# Patient Record
Sex: Male | Born: 1980 | Race: White | Hispanic: No | Marital: Single | Smoking: Never smoker
Health system: Southern US, Community
[De-identification: ages and names within clinical notes are randomized; demographics above are authoritative.]

## PROBLEM LIST (undated history)

## (undated) HISTORY — PX: APPENDECTOMY: SHX54

---

## 2003-06-02 ENCOUNTER — Encounter: Payer: Self-pay | Admitting: Occupational Medicine

## 2003-06-02 ENCOUNTER — Encounter: Admission: RE | Admit: 2003-06-02 | Discharge: 2003-06-02 | Payer: Self-pay | Admitting: Occupational Medicine

## 2006-02-03 ENCOUNTER — Emergency Department (HOSPITAL_COMMUNITY): Admission: EM | Admit: 2006-02-03 | Discharge: 2006-02-03 | Payer: Self-pay | Admitting: Emergency Medicine

## 2014-01-22 ENCOUNTER — Encounter (HOSPITAL_COMMUNITY): Payer: Self-pay | Admitting: Emergency Medicine

## 2014-01-22 ENCOUNTER — Emergency Department (HOSPITAL_COMMUNITY)
Admission: EM | Admit: 2014-01-22 | Discharge: 2014-01-22 | Disposition: A | Discharge status: Home / Self Care | Payer: PRIVATE HEALTH INSURANCE | Attending: Emergency Medicine | Admitting: Emergency Medicine

## 2014-01-22 DIAGNOSIS — S61209A Unspecified open wound of unspecified finger without damage to nail, initial encounter: Secondary | ICD-10-CM | POA: Insufficient documentation

## 2014-01-22 DIAGNOSIS — Y929 Unspecified place or not applicable: Secondary | ICD-10-CM | POA: Insufficient documentation

## 2014-01-22 DIAGNOSIS — W268XXA Contact with other sharp object(s), not elsewhere classified, initial encounter: Secondary | ICD-10-CM | POA: Insufficient documentation

## 2014-01-22 DIAGNOSIS — Y9389 Activity, other specified: Secondary | ICD-10-CM | POA: Insufficient documentation

## 2014-01-22 DIAGNOSIS — S61412A Laceration without foreign body of left hand, initial encounter: Secondary | ICD-10-CM

## 2014-01-22 NOTE — Discharge Instructions (Signed)
Tissue Adhesive Wound Care °Some cuts, wounds, lacerations, and incisions can be repaired by using tissue adhesive. Tissue adhesive is like glue. It holds the skin together, allowing for faster healing. It forms a strong bond on the skin in about 1 minute and reaches its full strength in about 2 or 3 minutes. The adhesive disappears naturally while the wound is healing. It is important to take proper care of your wound at home while it heals.  °HOME CARE INSTRUCTIONS  °· Showers are allowed. Do not soak the area containing the tissue adhesive. Do not take baths, swim, or use hot tubs. Do not use any soaps or ointments on the wound. Certain ointments can weaken the glue. °· If a bandage (dressing) has been applied, follow your health care provider's instructions for how often to change the dressing.   °· Keep the dressing dry if one has been applied.   °· Do not scratch, pick, or rub the adhesive.   °· Do not place tape over the adhesive. The adhesive could come off when pulling the tape off.   °· Protect the wound from further injury until it is healed.   °· Protect the wound from sun and tanning bed exposure while it is healing and for several weeks after healing.   °· Only take over-the-counter or prescription medicines as directed by your health care provider.   °· Keep all follow-up appointments as directed by your health care provider. °SEEK IMMEDIATE MEDICAL CARE IF:  °· Your wound becomes red, swollen, hot, or tender.   °· You develop a rash after the glue is applied. °· You have increasing pain in the wound.   °· You have a red streak that goes away from the wound.   °· You have pus coming from the wound.   °· You have increased bleeding. °· You have a fever. °· You have shaking chills.   °· You notice a bad smell coming from the wound.   °· Your wound or adhesive breaks open.   °MAKE SURE YOU:  °· Understand these instructions. °· Will watch your condition. °· Will get help right away if you are not doing  well or get worse. °Document Released: 06/06/2001 Document Revised: 10/01/2013 Document Reviewed: 07/02/2013 °Exit[employer]® Patient Information ©2014 [employer]. ° °

## 2014-01-22 NOTE — ED Notes (Signed)
Finger was cleaned w/ shur clens & skin glue applied by PA.

## 2014-01-22 NOTE — ED Notes (Signed)
Pt alert & oriented x4, stable gait. Patient given discharge instructions, paperwork & prescription(s). Patient  instructed to stop at the registration desk to finish any additional paperwork. Patient verbalized understanding. Pt left department w/ no further questions. 

## 2014-01-22 NOTE — ED Provider Notes (Signed)
CSN: [id number]     Arrival date & time 01/22/14  1910 History   First MD Initiated Contact with Patient 01/22/14 1936     Chief Complaint  Patient presents with  . Extremity Laceration   (Consider location/radiation/quality/duration/timing/severity/associated sxs/prior Treatment) Patient is a 33 y.o. male presenting with skin laceration.  Laceration Location:  Hand Hand laceration location:  L hand Depth:  Cutaneous Quality: straight   Bleeding: controlled   Laceration mechanism:  Metal edge Foreign body present:  No foreign bodies Ineffective treatments:  None tried Tetanus status:  Up to date   History reviewed. No pertinent past medical history. Past Surgical History  Procedure Laterality Date  . Appendectomy     No family history on file. History  Substance Use Topics  . Smoking status: Never Smoker   . Smokeless tobacco: Not on file  . Alcohol Use: Yes     Comment: occ    Review of Systems  Skin: Positive for wound.  All other systems reviewed and are negative.    Allergies  Morphine and related  Home Medications  No current outpatient prescriptions on file. BP 124/78  Pulse 66  Temp(Src) 98.4 F (36.9 C) (Oral)  Resp 18  Ht 6' (1.829 m)  Wt 225 lb (102.059 kg)  BMI 30.51 kg/m2  SpO2 99% Physical Exam  Nursing note and vitals reviewed. Constitutional: He is oriented to person, place, and time. He appears well-developed and well-nourished.  HENT:  Head: Normocephalic and atraumatic.  Eyes: Pupils are equal, round, and reactive to light.  Neck: Normal range of motion.  Cardiovascular: Normal rate and regular rhythm.   Pulmonary/Chest: Effort normal and breath sounds normal.  Abdominal: Soft. Bowel sounds are normal.  Musculoskeletal: Normal range of motion. He exhibits no edema.  Lymphadenopathy:    He has no cervical adenopathy.  Neurological: He is alert and oriented to person, place, and time.  Skin: Skin is warm and dry.  Superficial  laceration to palmer surface of left middle finder from MIP to PIP, and at base of ring finger.  No foreign bodies or active bleeding.  Psychiatric: He has a normal mood and affect.    ED Course  Procedures (including critical care time) Labs Review Labs Reviewed - No data to display Imaging Review No results found.  EKG Interpretation   None      Superficial laceration to left middle and ring finger.  Wound cleansed, dermabond applied. MDM  Superficial laceration.  Tetanus within 5 years.    Norman Herrlich, NP 01/22/14 2132

## 2014-01-22 NOTE — ED Notes (Signed)
Pt reports getting cut to the left fourth finger this morning around 0900. Thinks last tetanus shot was in 2006.

## 2014-01-23 NOTE — ED Provider Notes (Signed)
Medical screening examination/treatment/procedure(s) were performed by non-physician practitioner and as supervising physician I was immediately available for consultation/collaboration.  Carmin Muskrat, MD 01/23/14 [phone]

## 2017-03-26 ENCOUNTER — Encounter (HOSPITAL_COMMUNITY): Payer: Self-pay

## 2017-03-26 ENCOUNTER — Emergency Department (HOSPITAL_COMMUNITY): Payer: Worker's Compensation

## 2017-03-26 ENCOUNTER — Emergency Department (HOSPITAL_COMMUNITY)
Admission: EM | Admit: 2017-03-26 | Discharge: 2017-03-26 | Disposition: A | Discharge status: Home / Self Care | Payer: Worker's Compensation | Attending: Emergency Medicine | Admitting: Emergency Medicine

## 2017-03-26 DIAGNOSIS — Y9389 Activity, other specified: Secondary | ICD-10-CM | POA: Diagnosis not present

## 2017-03-26 DIAGNOSIS — S01511A Laceration without foreign body of lip, initial encounter: Secondary | ICD-10-CM | POA: Diagnosis present

## 2017-03-26 DIAGNOSIS — Y990 Civilian activity done for income or pay: Secondary | ICD-10-CM | POA: Insufficient documentation

## 2017-03-26 DIAGNOSIS — W228XXA Striking against or struck by other objects, initial encounter: Secondary | ICD-10-CM | POA: Insufficient documentation

## 2017-03-26 DIAGNOSIS — R51 Headache: Secondary | ICD-10-CM | POA: Diagnosis not present

## 2017-03-26 DIAGNOSIS — S0181XA Laceration without foreign body of other part of head, initial encounter: Secondary | ICD-10-CM

## 2017-03-26 DIAGNOSIS — Y9289 Other specified places as the place of occurrence of the external cause: Secondary | ICD-10-CM | POA: Insufficient documentation

## 2017-03-26 DIAGNOSIS — T1490XA Injury, unspecified, initial encounter: Secondary | ICD-10-CM

## 2017-03-26 MED ORDER — IBUPROFEN 800 MG PO TABS: 800.0000 mg | ORAL_TABLET | Freq: Three times a day (TID) | ORAL | 0 refills | Status: DC

## 2017-03-26 MED ORDER — IBUPROFEN 800 MG PO TABS
800.0000 mg | ORAL_TABLET | Freq: Once | ORAL | Status: AC
  Administered 2017-03-26: 800 mg via ORAL
  Filled 2017-03-26: qty 1

## 2017-03-26 MED ORDER — ONDANSETRON 4 MG PO TBDP
4.0000 mg | ORAL_TABLET | Freq: Once | ORAL | Status: AC
  Administered 2017-03-26: 4 mg via ORAL
  Filled 2017-03-26: qty 1

## 2017-03-26 MED ORDER — HYDROCODONE-ACETAMINOPHEN 5-325 MG PO TABS: 1.0000 | ORAL_TABLET | Freq: Four times a day (QID) | ORAL | 0 refills | Status: DC | PRN

## 2017-03-26 MED ORDER — TETANUS-DIPHTH-ACELL PERTUSSIS 5-2.5-18.5 LF-MCG/0.5 IM SUSP
0.5000 mL | Freq: Once | INTRAMUSCULAR | Status: AC
  Administered 2017-03-26: 0.5 mL via INTRAMUSCULAR
  Filled 2017-03-26: qty 0.5

## 2017-03-26 MED ORDER — LIDOCAINE HCL (PF) 1 % IJ SOLN
5.0000 mL | Freq: Once | INTRAMUSCULAR | Status: AC
  Administered 2017-03-26: 5 mL
  Filled 2017-03-26: qty 5

## 2017-03-26 MED ORDER — MAGIC MOUTHWASH W/LIDOCAINE: 5.0000 mL | Freq: Three times a day (TID) | ORAL | 0 refills | Status: DC | PRN

## 2017-03-26 MED ORDER — HYDROCODONE-ACETAMINOPHEN 5-325 MG PO TABS
1.0000 | ORAL_TABLET | Freq: Once | ORAL | Status: AC
  Administered 2017-03-26: 1 via ORAL
  Filled 2017-03-26: qty 1

## 2017-03-26 NOTE — ED Triage Notes (Signed)
Pt was hit in the mouth with a wrench at work. He has an approximately 2 inch laceration to the left lip to the bottom of his nose. Pt denies LOC.

## 2017-03-26 NOTE — ED Provider Notes (Signed)
[facility] DEPT Provider Note   By signing my name below, I, [name], attest that this documentation has been prepared under the direction and in the presence of Avie Echevaria, PA-C. Electronically Signed: [name], ED Scribe. 03/26/2017. 5:03 PM.   History   Chief Complaint Chief Complaint  Patient presents with  . Laceration    The history is provided by the patient. No language interpreter was used.    HPI Comments:  Frederick Durham is a 36 y.o. male who presents to the Emergency Department complaining of a wound sustained to the upper lip that occurred PTA. Pt reports that he was operating machinery at work and a metal wrench attached to a rotor spun around and struck him in the lips and teeth. He fell off of the platform he was standing on from about 3 feet, and states that he is unsure if he lost consciousness. He felt shook by it, but was immediately aware of what had just happened and recalls the events. Pt complains of associated faintest headache and right knee pain at this time, which feels like it does after he runs. He notes that he chipped several of his front teeth, and he has had an abnormal bite since the incident because he feels that his teeth were shifted. He states that his mouth and teeth are not in significant pain. Bleeding is controlled with pressure dressing. Denies numbness, dizziness, nausea, vomiting.    History reviewed. No pertinent past medical history.  There are no active problems to display for this patient.   Past Surgical History:  Procedure Laterality Date  . APPENDECTOMY         Home Medications    Prior to Admission medications   Medication Sig Start Date End Date Taking? Authorizing Provider  HYDROcodone-acetaminophen (NORCO/VICODIN) 5-325 MG tablet Take 1 tablet by mouth every 6 (six) hours as needed for severe pain. 03/26/17   Emeline General, PA-C  ibuprofen (ADVIL,MOTRIN) 800 MG tablet Take 1 tablet (800 mg  total) by mouth 3 (three) times daily. 03/26/17   Emeline General, PA-C  magic mouthwash w/lidocaine SOLN Take 5 mLs by mouth 3 (three) times daily as needed for mouth pain. 03/26/17   Emeline General, PA-C    Family History History reviewed. No pertinent family history.  Social History Social History  Substance Use Topics  . Smoking status: Never Smoker  . Smokeless tobacco: Never Used  . Alcohol use Yes     Comment: occ     Allergies   Morphine and related   Review of Systems Review of Systems  HENT: Positive for dental problem.   Respiratory: Negative for wheezing and stridor.   Cardiovascular: Negative for chest pain, palpitations and leg swelling.  Gastrointestinal: Negative for abdominal distention, nausea and vomiting.  Musculoskeletal: Positive for arthralgias. Negative for gait problem, joint swelling, neck pain and neck stiffness.  Skin: Positive for wound. Negative for color change.  Neurological: Positive for syncope and headaches. Negative for dizziness, facial asymmetry, speech difficulty, weakness, light-headedness and numbness.  All other systems reviewed and are negative.     Physical Exam Updated Vital Signs BP (!) 126/91 (BP Location: Left Arm)   Pulse 78   Temp 99 F (37.2 C) (Oral)   Resp 19   SpO2 95%   Physical Exam  Constitutional: He is oriented to person, place, and time. He appears well-developed and well-nourished. No distress.  Patient is afebrile, non-toxic appearing, seating comfortably in chair  in no acute distress.   HENT:  Head: Normocephalic and atraumatic.  Right Ear: External ear normal.  Left Ear: External ear normal.  Mouth/Throat: Oropharynx is clear and moist. No oropharyngeal exudate.  Both lower incisors chipped. Front upper left incisor has a chip on the back side. 2cm laceration through and through not involving vermilion border. Right upper front tooth with small contusion on the gingiva above, and tooth appears to  be slightly posterior, but not moveable/loose.  Eyes: Conjunctivae and EOM are normal. Pupils are equal, round, and reactive to light. Right eye exhibits no discharge. Left eye exhibits no discharge.  Neck: Normal range of motion. Neck supple.  Cardiovascular: Normal rate, regular rhythm, normal heart sounds and intact distal pulses.   Pulmonary/Chest: Effort normal and breath sounds normal. No respiratory distress. He has no wheezes. He has no rales. He exhibits no tenderness.  Abdominal: He exhibits no distension.  Musculoskeletal: Normal range of motion. He exhibits no edema or deformity.  Tender to medial aspect of right knee. No swelling or bruising. Full ROM.  Neurological: He is alert and oriented to person, place, and time. No cranial nerve deficit or sensory deficit.  Neurologic Exam:   - Mental status: Patient is alert and cooperative. Fluent speech and words are clear. Coherent thought processes and insight is good. Patient is oriented x 4 to person, place, time and event.   - Cranial nerves:  CN III, IV, VI: pupils equally round, reactive to light both direct and conscensual and normal accommodation. Full extra-ocular movement. CN V: motor temporalis and masseter strength intact. CN VII : muscles of facial expression intact. CN X :  midline uvula. XI strength of sternocleidomastoid and trapezius muscles 5/5, XII: tongue is midline when protruded.  - Motor: No involuntary movements. Muscle tone and bulk normal throughout. Muscle strength is 5/5 in bilateral   grip, hip flexion, ankle dorsiflexion and plantar flexion.   - Sensory: Proprioception, light tough sensation intact in all extremities.   - Cerebellar:  and point to point movement intact in upper  extremities. Normal stance and gait  Skin: Skin is warm and dry. He is not diaphoretic. No erythema. No pallor.  Psychiatric: He has a normal mood and affect.  Nursing note and vitals reviewed.    ED Treatments / Results    DIAGNOSTIC STUDIES:  Oxygen Saturation is 95% on RA, adequate by my interpretation.    COORDINATION OF CARE:  4:57 PM Will repair laceration. Discussed treatment plan with pt at bedside and pt agreed to plan.   Labs (all labs ordered are listed, but only abnormal results are displayed) Labs Reviewed - No data to display  EKG  EKG Interpretation None       Radiology Ct Head Wo Contrast  Result Date: 03/26/2017 CLINICAL DATA:  Initial evaluation for acute trauma, hit in face with wrench. EXAM: CT HEAD WITHOUT CONTRAST CT MAXILLOFACIAL WITHOUT CONTRAST TECHNIQUE: Multidetector CT imaging of the head and maxillofacial structures were performed using the standard protocol without intravenous contrast. Multiplanar CT image reconstructions of the maxillofacial structures were also generated. COMPARISON:  None. FINDINGS: CT HEAD FINDINGS Brain: Cerebral volume normal. No acute intracranial hemorrhage. No evidence for acute large vessel territory infarct. No mass lesion, midline shift or mass effect. No hydrocephalus. No extra-axial fluid collection. Vascular: No asymmetric hyperdense vessel. Skull: Scalp soft tissues within normal limits.  Calvarium intact. Other: No mastoid effusion. CT MAXILLOFACIAL FINDINGS Osseous: Zygomatic arches intact no acute maxillary fracture. Pterygoid plates  intact. Nasal bones intact. Nasal septum mildly bowed to the left but intact. No acute mandibular fracture. Mandibular condyles normally situated. No acute abnormality about the dentition. Orbits: Globes intact. No retro-orbital hematoma or other pathology. Bony orbits intact without evidence for orbital floor fracture. Sinuses: Mild mucosal thickening within the left maxillary sinus. Paranasal sinuses are otherwise clear. Soft tissues: Soft tissue contusion and laceration present at the right upper lip just inferior to the nose. No radiopaque foreign body. Few scattered foci of soft tissue emphysema within this  region. No other acute soft tissue injury. IMPRESSION: CT HEAD: No acute intracranial process identified. CT MAXILLOFACIAL: 1. Soft tissue laceration/injury at the right upper lip/premaxillary region. No radiopaque foreign body. 2. No other acute maxillofacial injury identified.  No fracture. Electronically Signed   By: Jeannine Boga M.D.   On: 03/26/2017 18:06   Ct Maxillofacial Wo Contrast  Result Date: 03/26/2017 CLINICAL DATA:  Initial evaluation for acute trauma, hit in face with wrench. EXAM: CT HEAD WITHOUT CONTRAST CT MAXILLOFACIAL WITHOUT CONTRAST TECHNIQUE: Multidetector CT imaging of the head and maxillofacial structures were performed using the standard protocol without intravenous contrast. Multiplanar CT image reconstructions of the maxillofacial structures were also generated. COMPARISON:  None. FINDINGS: CT HEAD FINDINGS Brain: Cerebral volume normal. No acute intracranial hemorrhage. No evidence for acute large vessel territory infarct. No mass lesion, midline shift or mass effect. No hydrocephalus. No extra-axial fluid collection. Vascular: No asymmetric hyperdense vessel. Skull: Scalp soft tissues within normal limits.  Calvarium intact. Other: No mastoid effusion. CT MAXILLOFACIAL FINDINGS Osseous: Zygomatic arches intact no acute maxillary fracture. Pterygoid plates intact. Nasal bones intact. Nasal septum mildly bowed to the left but intact. No acute mandibular fracture. Mandibular condyles normally situated. No acute abnormality about the dentition. Orbits: Globes intact. No retro-orbital hematoma or other pathology. Bony orbits intact without evidence for orbital floor fracture. Sinuses: Mild mucosal thickening within the left maxillary sinus. Paranasal sinuses are otherwise clear. Soft tissues: Soft tissue contusion and laceration present at the right upper lip just inferior to the nose. No radiopaque foreign body. Few scattered foci of soft tissue emphysema within this region. No  other acute soft tissue injury. IMPRESSION: CT HEAD: No acute intracranial process identified. CT MAXILLOFACIAL: 1. Soft tissue laceration/injury at the right upper lip/premaxillary region. No radiopaque foreign body. 2. No other acute maxillofacial injury identified.  No fracture. Electronically Signed   By: Jeannine Boga M.D.   On: 03/26/2017 18:06    Procedures Procedures (including critical care time)  LACERATION REPAIR Performed by: Emeline General Authorized by: Emeline General Consent: Verbal consent obtained. Risks and benefits: risks, benefits and alternatives were discussed Consent given by: patient Patient identity confirmed: provided demographic data Prepped and Draped in normal sterile fashion Wound explored  Laceration Location: upper lip  Laceration Length: 2cm  No Foreign Bodies seen or palpated  Anesthesia: local infiltration  Local anesthetic: lidocaine 1% w/o epinephrine  Anesthetic total: 3 ml  Irrigation method: syringe Amount of cleaning: standard  Skin closure: 5.0 absorbable mucosa/ 5.0 prolene skin  Number of sutures: 6 absorbables/ 5 non-absorbable  Technique: simple interrupted  Patient tolerance: Patient tolerated the procedure well with no immediate complications.  Medications Ordered in ED Medications  Tdap (BOOSTRIX) injection 0.5 mL (0.5 mLs Intramuscular Given 03/26/17 1906)  lidocaine (PF) (XYLOCAINE) 1 % injection 5 mL (5 mLs Infiltration Given 03/26/17 1906)  ibuprofen (ADVIL,MOTRIN) tablet 800 mg (800 mg Oral Given 03/26/17 2102)  ondansetron (ZOFRAN-ODT) disintegrating tablet  4 mg (4 mg Oral Given 03/26/17 2113)  HYDROcodone-acetaminophen (NORCO/VICODIN) 5-325 MG per tablet 1 tablet (1 tablet Oral Given 03/26/17 2113)     Initial Impression / Assessment and Plan / ED Course  I have reviewed the triage vital signs and the nursing notes.  Pertinent labs & imaging results that were available during my care of the patient were  reviewed by me and considered in my medical decision making (see chart for details).    Patient presents with injury to the upper lip following industrial machinery incident. Patient has a normal neuro exam, CT maxillofacial negative for fractures and no acute pathology on head CT. He has sustained a through and through upper lip laceration measuring 2 cm.  Pressure irrigation performed. Wound explored and base of wound visualized in a bloodless field without evidence of foreign body.  Laceration occurred < 8 hours prior to repair which was well tolerated. Tdap updated.  Pt has  no comorbidities to effect normal wound healing. Pt discharged without antibiotics.  Discussed suture home care with patient and answered questions. Pt to follow-up for wound check and suture removal in 3-5 days; they are to return to the ED sooner for signs of infection. Pt is hemodynamically stable with no complaints prior to dc.   Patient has good follow-up with a dentist and he will be calling in the morning. Patient is well-appearing and stable, exam is otherwise unremarkable, Discharge with symptomatic relief, dentist and PCP follow-up.  Discussed strict return precautions and advised to return to the emergency department if experiencing any new or worsening symptoms. Instructions were understood and patient agreed with discharge plan.  Final Clinical Impressions(s) / ED Diagnoses   Final diagnoses:  Facial laceration, initial encounter    New Prescriptions Discharge Medication List as of 03/26/2017  9:06 PM    START taking these medications   Details  HYDROcodone-acetaminophen (NORCO/VICODIN) 5-325 MG tablet Take 1 tablet by mouth every 6 (six) hours as needed for severe pain., Starting Mon 03/26/2017, Print    ibuprofen (ADVIL,MOTRIN) 800 MG tablet Take 1 tablet (800 mg total) by mouth 3 (three) times daily., Starting Mon 03/26/2017, Print    magic mouthwash w/lidocaine SOLN Take 5 mLs by mouth 3 (three) times  daily as needed for mouth pain., Starting Mon 03/26/2017, Print      I personally performed the services described in this documentation, which was scribed in my presence. The recorded information has been reviewed and is accurate.     Emeline General, PA-C 03/27/17 Penndel Liu, MD 03/27/17 [phone]

## 2017-03-26 NOTE — ED Notes (Signed)
EDP at bedside suturing patient's laceration to face.

## 2017-03-26 NOTE — Discharge Instructions (Signed)
Keep the area clean and dry. Follow up with your dentist tomorrow. Return to PCP or Emergency department for suture removal in 3-5 days. Monitor for any signs of infection including increased pain, redness, warmth, swelling, fever and return to be seen if your experience any of these symptoms or any new concerning symptoms. Liquid diet for the next two days.

## 2017-04-13 DIAGNOSIS — Z0000 Encounter for general adult medical examination without abnormal findings: Secondary | ICD-10-CM | POA: Diagnosis not present

## 2017-04-26 ENCOUNTER — Other Ambulatory Visit (HOSPITAL_COMMUNITY): Payer: Self-pay | Admitting: Family Medicine

## 2017-04-26 ENCOUNTER — Ambulatory Visit (HOSPITAL_COMMUNITY)
Admission: RE | Admit: 2017-04-26 | Discharge: 2017-04-26 | Disposition: A | Discharge status: Home / Self Care | Payer: 59 | Source: Ambulatory Visit | Attending: Family Medicine | Admitting: Family Medicine

## 2017-04-26 DIAGNOSIS — M4316 Spondylolisthesis, lumbar region: Secondary | ICD-10-CM | POA: Diagnosis not present

## 2017-04-26 DIAGNOSIS — K769 Liver disease, unspecified: Secondary | ICD-10-CM | POA: Insufficient documentation

## 2017-04-26 DIAGNOSIS — M545 Low back pain: Secondary | ICD-10-CM | POA: Diagnosis not present

## 2017-04-26 DIAGNOSIS — R1031 Right lower quadrant pain: Secondary | ICD-10-CM | POA: Diagnosis not present

## 2017-04-26 DIAGNOSIS — N200 Calculus of kidney: Secondary | ICD-10-CM

## 2017-04-26 DIAGNOSIS — R109 Unspecified abdominal pain: Secondary | ICD-10-CM | POA: Diagnosis not present

## 2017-04-30 ENCOUNTER — Other Ambulatory Visit (HOSPITAL_COMMUNITY): Payer: Self-pay | Admitting: Family Medicine

## 2017-04-30 DIAGNOSIS — K7689 Other specified diseases of liver: Secondary | ICD-10-CM

## 2017-05-04 ENCOUNTER — Ambulatory Visit (HOSPITAL_COMMUNITY)
Admission: RE | Admit: 2017-05-04 | Discharge: 2017-05-04 | Disposition: A | Discharge status: Home / Self Care | Payer: 59 | Source: Ambulatory Visit | Attending: Family Medicine | Admitting: Family Medicine

## 2017-05-04 DIAGNOSIS — K7689 Other specified diseases of liver: Secondary | ICD-10-CM | POA: Insufficient documentation

## 2017-05-04 DIAGNOSIS — D1803 Hemangioma of intra-abdominal structures: Secondary | ICD-10-CM | POA: Insufficient documentation

## 2017-05-04 MED ORDER — GADOXETATE DISODIUM 0.25 MMOL/ML IV SOLN
10.0000 mL | Freq: Once | INTRAVENOUS | Status: AC | PRN
  Administered 2017-05-04: 10 mL via INTRAVENOUS

## 2017-06-22 DIAGNOSIS — D1803 Hemangioma of intra-abdominal structures: Secondary | ICD-10-CM | POA: Diagnosis not present

## 2017-06-22 DIAGNOSIS — R5383 Other fatigue: Secondary | ICD-10-CM | POA: Diagnosis not present

## 2017-09-25 ENCOUNTER — Ambulatory Visit (INDEPENDENT_AMBULATORY_CARE_PROVIDER_SITE_OTHER): Payer: 59 | Admitting: Urology

## 2017-09-25 DIAGNOSIS — E291 Testicular hypofunction: Secondary | ICD-10-CM | POA: Diagnosis not present

## 2017-09-28 DIAGNOSIS — E291 Testicular hypofunction: Secondary | ICD-10-CM | POA: Diagnosis not present

## 2017-10-04 DIAGNOSIS — J019 Acute sinusitis, unspecified: Secondary | ICD-10-CM | POA: Diagnosis not present

## 2017-10-26 ENCOUNTER — Encounter: Payer: Self-pay | Admitting: Internal Medicine

## 2017-12-31 ENCOUNTER — Telehealth: Payer: Self-pay | Admitting: Internal Medicine

## 2018-01-25 ENCOUNTER — Ambulatory Visit: Payer: 59 | Admitting: Internal Medicine

## 2018-01-25 ENCOUNTER — Encounter: Payer: Self-pay | Admitting: Internal Medicine

## 2018-01-25 VITALS — BP 116/78 | HR 64 | Ht 72.75 in | Wt 246.2 lb

## 2018-01-25 DIAGNOSIS — E221 Hyperprolactinemia: Secondary | ICD-10-CM | POA: Diagnosis not present

## 2018-01-25 DIAGNOSIS — D352 Benign neoplasm of pituitary gland: Secondary | ICD-10-CM | POA: Diagnosis not present

## 2018-01-25 DIAGNOSIS — E559 Vitamin D deficiency, unspecified: Secondary | ICD-10-CM

## 2018-01-25 DIAGNOSIS — E230 Hypopituitarism: Secondary | ICD-10-CM

## 2018-01-25 NOTE — Patient Instructions (Signed)
Please come back for labs at 8 am, fasting.  After we start Cabergoline, please come back for labs in 2 months.  Please come back for a follow-up appointment in 6 months.  Pituitary Tumors Pituitary tumors are abnormal growths found in the pituitary gland. The pituitary gland is a small organ-about the size of a dime-located in the center of the brain. It makes hormones that affect growth and the functions of other glands in the body. Most pituitary tumors are benign. This means they are noncancerous. They grow slowly and do not spread to other parts of the body. A pituitary tumor may make the pituitary gland produce too many hormones. Tumors that make hormones are called functioning tumors (those that do not make hormones are called nonfunctioning tumors). Problems that can be caused by pituitary tumors include:  Cushing disease. This disease causes fat to build up in the face, back, and chest while the arms and legs become thin.  Acromegaly. This is a condition in which the hands, feet, and face are larger than normal.  Breast milk production even though there is no pregnancy.  What are the causes? The cause of most pituitary tumors is not known. In some cases, these kinds of tumors run in a family. What increases the risk? Some cases of pituitary tumors are due to genetic factors that a person inherits that increase the likelihood of developing certain tumors, including pituitary tumors. What are the signs or symptoms?  Headaches.  Vision problems.  Weakness or low energy.  Clear fluid draining from the nose.  Changes in the sense of smell.  Feeling sick to your stomach (nauseous) and vomiting.  Problems caused by the production of too many hormones, such as: ? Infertility. ? Loss of menstrual periods in women. ? Abnormal growth. ? High blood pressure (hypertension). ? Heat or cold intolerance. ? Other skin and body changes. ? Nipple discharge. ? Decreased sexual  function. How is this diagnosed? If you develop symptoms, you will be sent for a CT scan or MRI to look for pituitary tumors. If you know that these kinds of tumors run in your family, you may need to have your blood tested regularly to monitor pituitary hormone levels. How is this treated? These tumors are best treated when they are found and diagnosed early. Treatments include:  Surgical removal of the tumor. This is the most common treatment.  Radiation therapy. During this treatment, high doses of X-rays are used to kill tumor cells.  Drug therapy. This involves using certain medicines to block the pituitary gland from producing too many hormones.  Follow these instructions at home:  Drink plenty of fluids.  Measure your urine output if directed to do so by your health care provider.  Do not pick your nose or remove any crusting.  Do not do any activities that require straining.  Take all medicines as directed by your health care provider.  Keep follow-up appointments as directed by your health care provider. Contact a health care provider if:  You have sudden, unusual thirst.  You are urinating frequently.  You have a headache that will not go away.  You have new vision changes.  You notice clear fluid leaking from your nose or ears, a sensation of fluid trickling down the back of your throat, or a salty taste in your mouth.  You are having trouble concentrating. Get help right away if:  Your symptoms suddenly become severe.  You have a nosebleed that does not stop  after a few minutes.  You have a fever over 101F (38.3C).  You have a severe headache or a stiff neck.  You are confused or not as alert as usual.  You have chest pain or shortness of breath. This information is not intended to replace advice given to you by your health care provider. Make sure you discuss any questions you have with your health care provider. Document Released: 12/01/2002  Document Revised: 05/18/2016 Document Reviewed: 06/13/2013 Elsevier Interactive Patient Education  [employer].

## 2018-01-25 NOTE — Progress Notes (Signed)
Patient ID: Frederick Durham, male   DOB: [birth year], 37 y.o.   MRN: [id number]    HPI  Frederick Durham is a 37 y.o.-year-old male, referred by his PCP, Dr. Gar Ponto, for evaluation for a subcentimeter pituitary mass associated with high prolactin level and hypogonadotropic hypogonadism.  He is here with his wife who offers part of the history, especially related to the timeline and nature of his symptoms.  Patient describes that approximately 9-10 months ago he started to have increased fatigue, slight anxiety/depression, irritability.  Since then, he also started to gain weight (he believes he gained approximately 15 pounds over the last 6 months), also increased sweating.  He has slight decrease of libido but no problems with erections.  He saw his PCP and complained of the above symptoms.  The testosterone level was checked and it was low.  He was referred to urology.  Reviewed note and lab results from Dr. Diona Fanti.  An 8 am testosterone level was confirmed low (see below), and this was associated with inappropriately low LH and FSH (hypogonadotropic hypogonadism), but also a very high prolactin level, at 150.8.  A pituitary MRI was checked and this showed a microadenoma. At that point,patient was referred to endocrinology.  Pertinent labs reviewed: 09/28/2017: PRL 150.8, FSH 1.1, LH 2.3, 8 am testosterone: 119 [phone] Free T: 25.1 (46-224) No results found for: PROLACTIN   Pituitary MRI (10/26/2017): 7.9 x 7.3 x 4.2 mm nonenhancing pituitary nodule.  The actual images are not available for review. Will request the CD with the images.  Pt. also has a history of vitamin D deficiency >> prev. On 2000 IU daily >> now off.  ROS: Constitutional: + see HPI Eyes: no blurry vision, no xerophthalmia ENT: no sore throat, no nodules felt in neck no dysphagia/odynophagia, no hoarseness Cardiovascular: no CP/palpitations/leg swelling Respiratory: no cough/+ SOB Gastrointestinal: no  N/V/D/C Musculoskeletal: + muscle/no joint aches Skin: no rashes Neurological: no tremors/numbness/tingling/dizziness Psychiatric: + both: depression/anxiety  Past medical history: - per HPI  Past Surgical History:  Procedure Laterality Date  . APPENDECTOMY     Social History   Socioeconomic History  . Marital status: married    Spouse name: Not on file  . Number of children: 21 - som (75 years old in 12/2017)  Social Needs  Occupational History  . machinist  Tobacco Use  . Smoking status: Never Smoker  . Smokeless tobacco: Never Used  Substance and Sexual Activity  . Alcohol use: Yes    Comment: Beer, once a month, 3-6 drinks  . Drug use: No   Not on any Rx med.  Allergies  Allergen Reactions  . Morphine And Related Hives   FH: - M with valvular insufficiency  PE: BP 116/78   Pulse 64   Ht 6' 0.75" (1.848 m)   Wt 246 lb 3.2 oz (111.7 kg)   SpO2 95%   BMI 32.71 kg/m  Wt Readings from Last 3 Encounters:  01/25/18 246 lb 3.2 oz (111.7 kg)  01/22/14 225 lb (102.1 kg)   Constitutional: overweight, in NAD Eyes: PERRLA, EOMI, no exophthalmos ENT: moist mucous membranes, no thyromegaly, no cervical lymphadenopathy Cardiovascular: RRR, No MRG Respiratory: CTA B Gastrointestinal: abdomen soft, NT, ND, BS+ Musculoskeletal: no deformities, strength intact in all 4 Skin: moist, warm, no rashes Neurological: no tremor with outstretched hands, DTR normal in all 4   ASSESSMENT: 1.  Elevated prolactin  2.  Pituitary microadenoma   3.  Hypogonadotropic hypogonadism  4.  Vitamin D deficiency  PLAN:  1. And 2. Patient with a newly diagnosed pituitary microadenoma, in the setting of hypogonadotropic hypogonadism and hyperprolactinemia. - I reviewed the pituitary MRI report along with the patient. Snce the tumor is under 1 cm, this qualifies as a micro-, rather than a macro-adenoma.  - We discussed that his pituitary mass is not consider brain cancer and the vast  majority of these tumors are benign. - From the MRI report, it does not appear that the tumor has extended up towards the optic chiasm, and indeed, he does not describe any visual field cuts, or diplopia. - We discussed treatment of prolactinomas, with either cabergoline or bromocriptine.  I explained the benefits of cabergoline over bromocriptine.  We will be able to use  cabergoline less frequently than bromocriptine, it has less side effects, and it has the potential to shrink his tumor.  We did discuss about possible side effects to include: Headaches, congestion, dizziness.  I advised him to take it at bedtime. - We discussed about timeline for checking his prolactin again after we started cabergoline: First check will be in 2 months  - For now, we need to check his pituitary function, along with a repeat of his prolactin and testosterone levels, since last levels available up from 4 months ago. - We also discussed about the possibility of giving him a drug holiday in 2-5 years, depending on his prolactin levels on cabergoline  3. Patient with low total and free testosterone 2/2 hypogonadotropic hypogonadism 2/2 hyperprolactinemia - he was able to father a son 2 years ago  -no problems with fertility at that time  - No complaints of erectile dysfunction, but libido slightly low - We discussed that his testosterone level most likely start to improve as the prolactin starts to decrease - We will check another testosterone level now, and then after normalization of prolactin.  4.  Vitamin D deficiency - I explained that persistently high prolactin/low testosterone can lead to bone fragility.  A concomitant vitamin D deficiency can also contribute. - As he has a history of vitamin D deficiency and he is off supplements, we will check another level now  Orders Placed This Encounter  Procedures  . T3, free  . T4, free  . Insulin-like growth factor  . Cortisol  . Prolactin  . TSH  . ACTH  .  Testosterone Free with SHBG  . Vitamin D, 25-hydroxy   Component     Latest Ref Rng & Units 02/01/2018  Sex Hormone Binding Globulin     nmol/L 21.9  IGF-I, LC/MS     53 - 331 ng/mL 120  Z-Score (Male)     -2.0 - 2 SD -0.4  VITD     30.00 - 100.00 ng/mL 22.46 (L)  C206 ACTH     6 - 50 pg/mL 26  TSH     0.35 - 4.50 uIU/mL 1.72  Prolactin     2.0 - 18.0 ng/mL 133.0 (H)  Cortisol, Plasma     ug/dL 11.4  T4,Free(Direct)     0.60 - 1.60 ng/dL 0.97  Triiodothyronine,Free,Serum     2.3 - 4.2 pg/mL 3.2   Vitamin D is low, I would suggest to start 5000 units vitamin D daily. Prolactin is high, will start 0.25 mg cabergoline twice a week.  I will have him come back in 2 months for a prolactin and a vitamin D check.  Component     Latest Ref Rng & Units 02/01/2018  Testosterone, Serum (Total)     ng/dL 132 (L)  % Free Testosterone     % 2.5  Free Testosterone, S     pg/mL 33 (L)  Sex Hormone Binding Globulin     nmol/L 21.9  Low Testosterone >> we will continue to monitor, since this should improve with improvement in the prolactin level.  Philemon Kingdom, MD PhD [facility] Endocrinology

## 2018-02-01 ENCOUNTER — Other Ambulatory Visit (INDEPENDENT_AMBULATORY_CARE_PROVIDER_SITE_OTHER): Payer: 59

## 2018-02-01 DIAGNOSIS — E559 Vitamin D deficiency, unspecified: Secondary | ICD-10-CM

## 2018-02-01 DIAGNOSIS — D352 Benign neoplasm of pituitary gland: Secondary | ICD-10-CM

## 2018-02-01 LAB — T4, FREE: FREE T4: 0.97 ng/dL (ref 0.60–1.60)

## 2018-02-01 LAB — TSH: TSH: 1.72 u[IU]/mL (ref 0.35–4.50)

## 2018-02-01 LAB — T3, FREE: T3, Free: 3.2 pg/mL (ref 2.3–4.2)

## 2018-02-01 LAB — CORTISOL: CORTISOL PLASMA: 11.4 ug/dL

## 2018-02-01 LAB — VITAMIN D 25 HYDROXY (VIT D DEFICIENCY, FRACTURES): VITD: 22.46 ng/mL — AB (ref 30.00–100.00)

## 2018-02-05 ENCOUNTER — Other Ambulatory Visit: Payer: Self-pay | Admitting: Internal Medicine

## 2018-02-05 LAB — PROLACTIN: Prolactin: 133 ng/mL — ABNORMAL HIGH (ref 2.0–18.0)

## 2018-02-05 LAB — ACTH: C206 ACTH: 26 pg/mL (ref 6–50)

## 2018-02-05 LAB — INSULIN-LIKE GROWTH FACTOR
IGF-I, LC/MS: 120 ng/mL (ref 53–331)
Z-Score (Male): -0.4 SD (ref ?–2.0)

## 2018-02-05 MED ORDER — CABERGOLINE 0.5 MG PO TABS: 0.2500 mg | ORAL_TABLET | ORAL | 6 refills | Status: DC

## 2018-02-06 LAB — TESTOSTERONE, FREE AND TOTAL (INCLUDES SHBG)-(MALES)
% FREE TESTOSTERONE: 2.5 %
FREE TESTOSTERONE, S: 33 pg/mL — AB
SEX HORMONE BINDING GLOBULIN: 21.9 nmol/L
TESTOSTERONE, SERUM (TOTAL): 132 ng/dL — AB

## 2018-03-22 ENCOUNTER — Telehealth: Payer: Self-pay | Admitting: Internal Medicine

## 2018-03-22 NOTE — Telephone Encounter (Signed)
Patient wife is calling to let you know [facility] is sending over patient MRI results. Just a fyi  Let her know when they come

## 2018-03-25 ENCOUNTER — Encounter: Payer: Self-pay | Admitting: Internal Medicine

## 2018-03-25 NOTE — Telephone Encounter (Signed)
Noted  
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
## 2018-04-19 ENCOUNTER — Other Ambulatory Visit (INDEPENDENT_AMBULATORY_CARE_PROVIDER_SITE_OTHER): Payer: 59

## 2018-04-19 DIAGNOSIS — E559 Vitamin D deficiency, unspecified: Secondary | ICD-10-CM

## 2018-04-19 DIAGNOSIS — D352 Benign neoplasm of pituitary gland: Secondary | ICD-10-CM | POA: Diagnosis not present

## 2018-04-19 LAB — VITAMIN D 25 HYDROXY (VIT D DEFICIENCY, FRACTURES): VITD: 41.55 ng/mL (ref 30.00–100.00)

## 2018-04-20 LAB — PROLACTIN: PROLACTIN: 18.8 ng/mL — AB (ref 2.0–18.0)

## 2018-05-13 DIAGNOSIS — J069 Acute upper respiratory infection, unspecified: Secondary | ICD-10-CM | POA: Diagnosis not present

## 2018-05-13 DIAGNOSIS — J029 Acute pharyngitis, unspecified: Secondary | ICD-10-CM | POA: Diagnosis not present

## 2018-08-13 ENCOUNTER — Ambulatory Visit: Payer: Self-pay | Admitting: Internal Medicine

## 2018-08-16 ENCOUNTER — Encounter: Payer: Self-pay | Admitting: Internal Medicine

## 2018-08-16 ENCOUNTER — Ambulatory Visit: Payer: 59 | Admitting: Internal Medicine

## 2018-08-16 VITALS — BP 122/80 | HR 72 | Ht 72.75 in | Wt 236.6 lb

## 2018-08-16 DIAGNOSIS — E230 Hypopituitarism: Secondary | ICD-10-CM

## 2018-08-16 DIAGNOSIS — E559 Vitamin D deficiency, unspecified: Secondary | ICD-10-CM | POA: Diagnosis not present

## 2018-08-16 DIAGNOSIS — E221 Hyperprolactinemia: Secondary | ICD-10-CM | POA: Diagnosis not present

## 2018-08-16 DIAGNOSIS — D352 Benign neoplasm of pituitary gland: Secondary | ICD-10-CM | POA: Diagnosis not present

## 2018-08-16 NOTE — Patient Instructions (Signed)
Please continue Cabergoline 0.25 mg 2x a week.  Continue vitamin D 5000 units daily.  Please come back for labs fasting, 8-9 am.  Please come back for a follow-up appointment in 1 year.

## 2018-08-16 NOTE — Progress Notes (Addendum)
Patient ID: Frederick Durham, male   DOB: 01-11-1981, 37 y.o.   MRN: [id number]    HPI  Frederick Durham is a 37 y.o.-year-old male, initially referred by his PCP, Dr. Gar Ponto, returning for follow-up for subcentimeter pituitary mass associated with high prolactin level and hypogonadotropic hypogonadism.  Last visit 6 months ago.  Reviewed history: Patient describes that in 2018 he started to have increased fatigue, slight anxiety/depression, irritability.  Since then, he also started to gain weight (he believes he gained approximately 15 pounds over the last 6 months), also increased sweating.  He has slight decrease of libido but no problems with erections.  He saw his PCP and complained of the above symptoms.  The testosterone level was checked and it was low.  He was referred to urology.  Reviewed note and lab results from Dr. Diona Fanti.  An 8 am testosterone level was confirmed low (see below), and this was associated with inappropriately low LH and FSH (hypogonadotropic hypogonadism), but also a very high prolactin level, at 150.8.  A pituitary MRI was checked and this showed a microadenoma. At that point,patient was referred to endocrinology.  At last visit, we reviewed together his prolactin levels and prolactin was still elevated, at 133.  We started cabergoline 0.25 mg twice a week and prolactin decreased nicely to 18.8.  We continued the same dose pending the results today.  He does not report side effects to cabergoline, other than a slight headache but not with every dose.  No nausea, dizziness, congestion.  Pertinent labs were reviewed: Lab Results  Component Value Date   PROLACTIN 18.8 (H) 04/19/2018   PROLACTIN 133.0 (H) 02/01/2018  09/28/2017: PRL 150.8, FSH 1.1, LH 2.3, 8 am testosterone: 119 [phone]), Free T: 25.1 (46-224)  Pituitary MRI (10/26/2017): 7.9 x 7.3 x 4.2 mm nonenhancing pituitary nodule.  The actual images are not available for review.  We will need to request the  CD with the images.  His testosterone level was low at last check, but the rest of the pituitary labs were normal: Component     Latest Ref Rng & Units 02/01/2018  Testosterone, Serum (Total)     ng/dL 132 (L)  % Free Testosterone     % 2.5  Free Testosterone, S     pg/mL 33 (L)  Sex Hormone Binding Globulin     nmol/L 21.9  IGF-I, LC/MS     53 - 331 ng/mL 120  Z-Score (Male)     -2.0 - 2 SD -0.4  C206 ACTH     6 - 50 pg/mL 26  TSH     0.35 - 4.50 uIU/mL 1.72  Cortisol, Plasma     ug/dL 11.4  T4,Free(Direct)     0.60 - 1.60 ng/dL 0.97  Triiodothyronine,Free,Serum     2.3 - 4.2 pg/mL 3.2   No decrease in libido or problems with erections.  Pt. also has a history of vitamin D deficiency >> prev. On 2000 IU daily >> then off, however, we restarted vitamin D supplements at 5000 units daily at last visit after vitamin D level returned low. The level normalized. Lab Results  Component Value Date   VD25OH 41.55 04/19/2018   VD25OH 22.46 (L) 02/01/2018   ROS: Constitutional: no weight gain/no weight loss, + occasional fatigue, no subjective hyperthermia, no subjective hypothermia Eyes: no blurry vision, no xerophthalmia ENT: no sore throat, no nodules palpated in throat, no dysphagia, no odynophagia, no hoarseness Cardiovascular: no CP/no SOB/no palpitations/no leg  swelling Respiratory: no cough/no SOB/no wheezing Gastrointestinal: no N/no V/no D/no C/no acid reflux Musculoskeletal: no muscle aches/no joint aches Skin: no rashes, no hair loss Neurological: no tremors/no numbness/no tingling/no dizziness, + occasional headache  I reviewed pt's medications, allergies, PMH, social hx, family hx, and changes were documented in the history of present illness. Otherwise, unchanged from my initial visit note.  Past medical history: - per HPI  Past Surgical History:  Procedure Laterality Date  . APPENDECTOMY     Social History   Socioeconomic History  . Marital status:  married    Spouse name: Not on file  . Number of children: 55 - som (3 years old in 12/2017)  Social Needs  Occupational History  . machinist  Tobacco Use  . Smoking status: Never Smoker  . Smokeless tobacco: Never Used  Substance and Sexual Activity  . Alcohol use: Yes    Comment: Beer, once a month, 3-6 drinks  . Drug use: No   Not on any Rx med.  Allergies  Allergen Reactions  . Morphine And Related Hives   FH: - M with valvular insufficiency  PE: BP 122/80   Pulse 72   Ht 6' 0.75" (1.848 m)   Wt 236 lb 9.6 oz (107.3 kg)   SpO2 96%   BMI 31.43 kg/m  Wt Readings from Last 3 Encounters:  08/16/18 236 lb 9.6 oz (107.3 kg)  01/25/18 246 lb 3.2 oz (111.7 kg)  01/22/14 225 lb (102.1 kg)   Constitutional: overweight, in NAD Eyes: PERRLA, EOMI, no exophthalmos ENT: moist mucous membranes, no thyromegaly, no cervical lymphadenopathy Cardiovascular: RRR, No MRG Respiratory: CTA B Gastrointestinal: abdomen soft, NT, ND, BS+ Musculoskeletal: no deformities, strength intact in all 4 Skin: moist, warm, no rashes Neurological: no tremor with outstretched hands, DTR normal in all 4   ASSESSMENT: 1.  Elevated prolactin  2.  Pituitary microadenoma   3.  Hypogonadotropic hypogonadism  4.  Vitamin D deficiency  PLAN:  1. And 2.  Patient with relatively recent diagnosis of pituitary microadenoma discovered after diagnosis of hypogonadotropic hypogonadism and hyperprolactinemia -Reviewing the pituitary MRI report his tumor is under 1 cm, therefore, a microadenoma -We discussed that the tumor did not extend towards the optic chiasm and he does not have any complaints of visual field cuts -At last visit we started cabergoline 0.25 mg twice a week and we checked his prolactin level 2 months after he started therapy.  His prolactin level was significantly decreased, to 18.8.  At that time, we continued the same dose of cabergoline, however, we will check another level today and I  believe that we can safely decrease the dose of cabergoline to 0.25 mg once a week after this visit -In 2 to 5 years we may be able to give him a drug holiday from cabergoline depending on his prolactin levels  3. Patient with low total and free testosterone due to hypogonadotropic hypogonadism in the setting of hyperprolactinemia -He was able to father a child 2.5 years ago.  No fertility problems at that time -She had slightly low libido but no problems with erectile dysfunction at last visit.  At that time, we checked a testosterone level and his free testosterone was low, however, since we were treating his hyperprolactinemia, we decided to wait until this visit to recheck it to see if improvement in his prolactin level will be reflecting an improvement in his testosterone level. -We will check his testosterone at next lab draw, fasting, between 8  and 9 in the morning  4.  Vitamin D deficiency -His vitamin D was low at last visit, a 22.46.  I advised him to start 5000 units vitamin D daily. -His vitamin D level normalized on the supplement.  To make sure he is not taking too much, we will recheck his vitamin D level today.  Orders Placed This Encounter  Procedures  . Testosterone Free with SHBG  . Prolactin  . VITAMIN D 25 Hydroxy (Vit-D Deficiency, Fractures)   Component     Latest Ref Rng & Units 08/23/2018  Testosterone, Serum (Total)     ng/dL 355  % Free Testosterone     % 2.9  Free Testosterone, S     pg/mL 103  Sex Hormone Binding Globulin     nmol/L 19.2  VITD     30.00 - 100.00 ng/mL 56.29  Prolactin     2.0 - 18.0 ng/mL 18.2 (H)   Testosterone level has normalized. PRL stable. Normal vit D. Continue current Cabergoline and vit D dose.  Philemon Kingdom, MD PhD [facility] Endocrinology

## 2018-08-23 ENCOUNTER — Other Ambulatory Visit (INDEPENDENT_AMBULATORY_CARE_PROVIDER_SITE_OTHER): Payer: 59

## 2018-08-23 DIAGNOSIS — E559 Vitamin D deficiency, unspecified: Secondary | ICD-10-CM

## 2018-08-23 DIAGNOSIS — D352 Benign neoplasm of pituitary gland: Secondary | ICD-10-CM

## 2018-08-23 DIAGNOSIS — E230 Hypopituitarism: Secondary | ICD-10-CM

## 2018-08-23 LAB — VITAMIN D 25 HYDROXY (VIT D DEFICIENCY, FRACTURES): VITD: 56.29 ng/mL (ref 30.00–100.00)

## 2018-08-24 LAB — PROLACTIN: PROLACTIN: 18.2 ng/mL — AB (ref 2.0–18.0)

## 2018-08-31 LAB — TESTOSTERONE, FREE AND TOTAL (INCLUDES SHBG)-(MALES)
% FREE TESTOSTERONE: 2.9 %
Free Testosterone, S: 103 pg/mL
SEX HORMONE BINDING GLOBULIN: 19.2 nmol/L
TESTOSTERONE, SERUM (TOTAL): 355 ng/dL

## 2019-03-04 ENCOUNTER — Other Ambulatory Visit: Payer: Self-pay | Admitting: Internal Medicine

## 2019-05-20 ENCOUNTER — Other Ambulatory Visit: Payer: Self-pay | Admitting: Internal Medicine

## 2019-08-10 ENCOUNTER — Other Ambulatory Visit: Payer: Self-pay | Admitting: Internal Medicine

## 2019-08-19 ENCOUNTER — Other Ambulatory Visit: Payer: Self-pay

## 2019-08-22 ENCOUNTER — Ambulatory Visit: Payer: 59 | Admitting: Internal Medicine

## 2019-08-22 ENCOUNTER — Other Ambulatory Visit: Payer: Self-pay

## 2019-08-22 ENCOUNTER — Encounter: Payer: Self-pay | Admitting: Internal Medicine

## 2019-08-22 VITALS — BP 110/70 | HR 60 | Ht 72.75 in | Wt 235.0 lb

## 2019-08-22 DIAGNOSIS — E230 Hypopituitarism: Secondary | ICD-10-CM | POA: Diagnosis not present

## 2019-08-22 DIAGNOSIS — D352 Benign neoplasm of pituitary gland: Secondary | ICD-10-CM

## 2019-08-22 DIAGNOSIS — E559 Vitamin D deficiency, unspecified: Secondary | ICD-10-CM | POA: Diagnosis not present

## 2019-08-22 DIAGNOSIS — E221 Hyperprolactinemia: Secondary | ICD-10-CM

## 2019-08-22 LAB — VITAMIN D 25 HYDROXY (VIT D DEFICIENCY, FRACTURES): VITD: 52.63 ng/mL (ref 30.00–100.00)

## 2019-08-22 MED ORDER — CABERGOLINE 0.5 MG PO TABS: ORAL_TABLET | ORAL | 3 refills | Status: DC

## 2019-08-22 NOTE — Progress Notes (Signed)
Patient ID: Frederick Durham, male   DOB: 05-13-1981, 38 y.o.   MRN: [id number]    HPI  Frederick Durham is a 38 y.o.-year-old male, initially referred by his PCP, Dr. Gar Durham, returning for follow-up for subcentimeter pituitary mass associated with high prolactin level, hypogonadotropic hypogonadism, vitamin D deficiency.  Last visit 1 year ago.  He had a sinus infection 6 weeks ago.   Reviewed history: Patient describes that in 2018 he started to have increased fatigue, slight anxiety/depression, irritability.  Since then, he also started to gain weight (he believes he gained approximately 15 pounds over the last 6 months), also increased sweating.  He has slight decrease of libido but no problems with erections.  He saw his PCP and complained of the above symptoms.  The testosterone level was checked and it was low.  He was referred to urology.  Reviewed note and lab results from Dr. Diona Durham.  An 8 am testosterone level was confirmed low (see below), and this was associated with inappropriately low LH and FSH (hypogonadotropic hypogonadism), but also a very high prolactin level, at 150.8.  A pituitary MRI was checked and this showed a microadenoma. At that point,patient was referred to endocrinology.  Patient with history of elevated prolactin, at 150, which improved significantly after addition of cabergoline 0.25 mg twice a week.  Latest prolactin level was reviewed along with the patient: This was only slightly above the upper limit of normal (normal interval 2-18).  He has no side effects from Cabergoline: No nausea, dizziness, congestion, headaches.  His prolactin levels were reviewed: Lab Results  Component Value Date   PROLACTIN 18.2 (H) 08/23/2018   PROLACTIN 18.8 (H) 04/19/2018   PROLACTIN 133.0 (H) 02/01/2018  09/28/2017: PRL 150.8, FSH 1.1, LH 2.3, 8 am testosterone: 119 [phone]), Free T: 25.1 (46-224)  Pituitary MRI (10/26/2017): 7.9 x 7.3 x 4.2 mm nonenhancing pituitary  nodule.  The actual images are not available for review.   His testosterone level was low in 01/2018 but normalized after normalization of his prolactin: Component     Latest Ref Rng & Units 08/23/2018  Testosterone, Serum (Total)     ng/dL 355  % Free Testosterone     % 2.9  Free Testosterone, S     pg/mL 103  Sex Hormone Binding Globulin     nmol/L 19.2   Component     Latest Ref Rng & Units 02/01/2018  Testosterone, Serum (Total)     ng/dL 132 (L)  % Free Testosterone     % 2.5  Free Testosterone, S     pg/mL 33 (L)  Sex Hormone Binding Globulin     nmol/L 21.9  IGF-I, LC/MS     53 - 331 ng/mL 120  Z-Score (Male)     -2.0 - 2 SD -0.4  C206 ACTH     6 - 50 pg/mL 26  TSH     0.35 - 4.50 uIU/mL 1.72  Cortisol, Plasma     ug/dL 11.4  T4,Free(Direct)     0.60 - 1.60 ng/dL 0.97  Triiodothyronine,Free,Serum     2.3 - 4.2 pg/mL 3.2   No decreased libido or problems with erections.  He has a history of vitamin D deficiency.  He was previously on 2000 units daily, and currently on 5000 units daily.  On this dose, his vitamin D was normal at last visit: Lab Results  Component Value Date   VD25OH 56.29 08/23/2018   VD25OH 41.55 04/19/2018  VD25OH 22.46 (L) 02/01/2018   ROS: Constitutional: no weight gain/no weight loss, no fatigue, no subjective hyperthermia, no subjective hypothermia, + insomnia Eyes: no blurry vision, no xerophthalmia ENT: no sore throat, no nodules palpated in neck, no dysphagia, no odynophagia, no hoarseness Cardiovascular: no CP/no SOB/no palpitations/no leg swelling Respiratory: no cough/no SOB/no wheezing Gastrointestinal: no N/no V/no D/no C/no acid reflux Musculoskeletal: no muscle aches/no joint aches Skin: no rashes, no hair loss Neurological: no tremors/no numbness/no tingling/no dizziness  I reviewed pt's medications, allergies, PMH, social hx, family hx, and changes were documented in the history of present illness. Otherwise, unchanged  from my initial visit note.  Past medical history: - per HPI  Past Surgical History:  Procedure Laterality Date  . APPENDECTOMY     Social History   Socioeconomic History  . Marital status: married    Spouse name: Not on file  . Number of children: 22 - som (57 years old in 12/2017)  Social Needs  Occupational History  . machinist  Tobacco Use  . Smoking status: Never Smoker  . Smokeless tobacco: Never Used  Substance and Sexual Activity  . Alcohol use: Yes    Comment: Beer, once a month, 3-6 drinks  . Drug use: No   Not on any Rx med.  Allergies  Allergen Reactions  . Morphine And Related Hives   FH: - M with valvular insufficiency  PE: There were no vitals taken for this visit. Wt Readings from Last 3 Encounters:  08/16/18 236 lb 9.6 oz (107.3 kg)  01/25/18 246 lb 3.2 oz (111.7 kg)  01/22/14 225 lb (102.1 kg)   Constitutional: overweight, in NAD Eyes: PERRLA, EOMI, no exophthalmos ENT: moist mucous membranes, no thyromegaly, no cervical lymphadenopathy Cardiovascular: RRR, No MRG Respiratory: CTA B Gastrointestinal: abdomen soft, NT, ND, BS+ Musculoskeletal: no deformities, strength intact in all 4 Skin: moist, warm, no rashes Neurological: no tremor with outstretched hands, DTR normal in all 4  ASSESSMENT: 1.  Elevated prolactin  2.  Pituitary microadenoma   3.  Hypogonadotropic hypogonadism  4.  Vitamin D deficiency  PLAN:  1. And 2.  Patient with history of pituitary microadenoma, discovered after diagnosis of hypogonadotropic hypogonadism and hyperprolactinemia.  We reviewed together the report of his pituitary MRI from 2018.  The tumor does not extend towards the optic chiasm and he does not have any complaints of visual field cuts. -We started Cabergoline 0.25 mg twice a week and his prolactin level improved significantly.  At last visit, this was slightly above the upper limit of normal so we continued the same dose of Cabergoline.  We did discuss  that if the prolactin level improves, we may be able to decrease the dose of cabergoline to 0.25 mg weekly. -No side effects from Cabergoline: No dizziness, headaches, nausea, congestion -We will recheck a prolactin level today -If his level decreases and states normal for 2 to 5 years, we can give him a drug holiday from Cabergoline.  3. Patient with low total and free testosterone due to hypogonadotropic hypogonadism in the setting of hyperprolactinemia -He was able to fathered a child 3.5 years ago.  No fertility problems at the time -After normalization of his prolactin level, testosterone level normalized-reviewed the level from last visit. - no hypogonadic sxs at this visit  4.  Vitamin D deficiency -He has a history of low vitamin D at 22.46 in the past.  We started 5000 units vitamin D daily.   -Vitamin D level normalized on  this dose, at last visit -We will recheck another level today  Component     Latest Ref Rng & Units 08/22/2019  VITD     30.00 - 100.00 ng/mL 52.63  Prolactin     2.0 - 18.0 ng/mL 16.7  Prolactin and vitamin D levels are normal.  We will continue the current dose of Cabergoline in the current vitamin D dose.  Philemon Kingdom, MD PhD [facility] Endocrinology

## 2019-08-22 NOTE — Patient Instructions (Signed)
Please continue Cabergoline 0.25 mg 2x a week.  Continue vitamin D 5000 units daily.  Please stop at the lab.  Please come back for a follow-up appointment in 1 year. 

## 2019-08-23 LAB — PROLACTIN: Prolactin: 16.7 ng/mL (ref 2.0–18.0)

## 2019-09-30 ENCOUNTER — Other Ambulatory Visit: Payer: Self-pay

## 2019-09-30 DIAGNOSIS — Z20822 Contact with and (suspected) exposure to covid-19: Secondary | ICD-10-CM

## 2019-10-02 LAB — NOVEL CORONAVIRUS, NAA: SARS-CoV-2, NAA: NOT DETECTED

## 2019-10-16 ENCOUNTER — Other Ambulatory Visit: Payer: Self-pay | Admitting: Internal Medicine

## 2019-12-05 ENCOUNTER — Other Ambulatory Visit: Payer: Self-pay

## 2019-12-05 DIAGNOSIS — Z20822 Contact with and (suspected) exposure to covid-19: Secondary | ICD-10-CM

## 2019-12-06 LAB — NOVEL CORONAVIRUS, NAA: SARS-CoV-2, NAA: NOT DETECTED

## 2019-12-30 ENCOUNTER — Ambulatory Visit: Payer: PRIVATE HEALTH INSURANCE | Attending: Internal Medicine

## 2019-12-30 ENCOUNTER — Other Ambulatory Visit: Payer: Self-pay

## 2019-12-30 DIAGNOSIS — Z20822 Contact with and (suspected) exposure to covid-19: Secondary | ICD-10-CM

## 2020-01-01 LAB — NOVEL CORONAVIRUS, NAA: SARS-CoV-2, NAA: DETECTED — AB

## 2020-08-20 ENCOUNTER — Ambulatory Visit (INDEPENDENT_AMBULATORY_CARE_PROVIDER_SITE_OTHER): Payer: BC Managed Care – PPO | Admitting: Internal Medicine

## 2020-08-20 ENCOUNTER — Other Ambulatory Visit: Payer: Self-pay

## 2020-08-20 ENCOUNTER — Encounter: Payer: Self-pay | Admitting: Internal Medicine

## 2020-08-20 VITALS — BP 122/80 | HR 60 | Ht 72.75 in | Wt 226.0 lb

## 2020-08-20 DIAGNOSIS — D352 Benign neoplasm of pituitary gland: Secondary | ICD-10-CM | POA: Diagnosis not present

## 2020-08-20 DIAGNOSIS — E559 Vitamin D deficiency, unspecified: Secondary | ICD-10-CM

## 2020-08-20 DIAGNOSIS — E221 Hyperprolactinemia: Secondary | ICD-10-CM

## 2020-08-20 LAB — PROLACTIN: Prolactin: 19.4 ng/mL — ABNORMAL HIGH (ref 2.0–18.0)

## 2020-08-20 NOTE — Progress Notes (Signed)
Patient ID: Frederick Durham, male   DOB: October 08, 1981, 39 y.o.   MRN: [id number]   This visit occurred during the SARS-CoV-2 public health emergency.  Safety protocols were in place, including screening questions prior to the visit, additional usage of staff PPE, and extensive cleaning of exam room while observing appropriate contact time as indicated for disinfecting solutions.   HPI  Frederick Durham is a 39 y.o.-year-old male, initially referred by his PCP, Dr. Gar Ponto, returning for follow-up for subcentimeter pituitary mass associated with high prolactin level, hypogonadotropic hypogonadism, vitamin D deficiency.  Last visit 1 year ago.  He had mild Covid19 in 12/2019 >> resolved completely.  Reviewed history: Patient describes that in 2018 he started to have increased fatigue, slight anxiety/depression, irritability.  Since then, he also started to gain weight (he believes he gained approximately 15 pounds over the last 6 months), also increased sweating.  He has slight decrease of libido but no problems with erections.  He saw his PCP and complained of the above symptoms.  The testosterone level was checked and it was low.  He was referred to urology.  Reviewed note and lab results from Dr. Diona Fanti.  An 8 am testosterone level was confirmed low (see below), and this was associated with inappropriately low LH and FSH (hypogonadotropic hypogonadism), but also a very high prolactin level, at 150.8.  A pituitary MRI was checked and this showed a microadenoma. At that point,patient was referred to endocrinology.  Patient has a history of elevated prolactin, of 150, which improved significantly after addition of cabergoline 0.25 mg twice a week.   He does not not have any nausea, dizziness, congestion. He does feel a little sluggish and occasionally a mild transient HA in the am after taking Cabergoline.  Prolactin levels were reviewed: Lab Results  Component Value Date   PROLACTIN 16.7  08/22/2019   PROLACTIN 18.2 (H) 08/23/2018   PROLACTIN 18.8 (H) 04/19/2018   PROLACTIN 133.0 (H) 02/01/2018  09/28/2017: PRL 150.8, FSH 1.1, LH 2.3, 8 am testosterone: 119 ([id number]), Free T: 25.1 (46-224)  Pituitary MRI (10/26/2017): 7.9 x 7.3 x 4.2 mm nonenhancing pituitary nodule.  The actual images are not available for review.   His testosterone level was low in 01/2018 but normalized after normalization of his prolactin level Component     Latest Ref Rng & Units 08/23/2018  Testosterone, Serum (Total)     ng/dL 355  % Free Testosterone     % 2.9  Free Testosterone, S     pg/mL 103  Sex Hormone Binding Globulin     nmol/L 19.2   Component     Latest Ref Rng & Units 02/01/2018  Testosterone, Serum (Total)     ng/dL 132 (L)  % Free Testosterone     % 2.5  Free Testosterone, S     pg/mL 33 (L)  Sex Hormone Binding Globulin     nmol/L 21.9  IGF-I, LC/MS     53 - 331 ng/mL 120  Z-Score (Male)     -2.0 - 2 SD -0.4  C206 ACTH     6 - 50 pg/mL 26  TSH     0.35 - 4.50 uIU/mL 1.72  Cortisol, Plasma     ug/dL 11.4  T4,Free(Direct)     0.60 - 1.60 ng/dL 0.97  Triiodothyronine,Free,Serum     2.3 - 4.2 pg/mL 3.2   No decreased libido or problems with erections  He has a history of vitamin D deficiency, which  resolved after starting vitamin D supplement.  He was previously on 2000 units daily, currently on 5000 units daily.   Lab Results  Component Value Date   VD25OH 52.63 08/22/2019   VD25OH 56.29 08/23/2018   VD25OH 41.55 04/19/2018   VD25OH 22.46 (L) 02/01/2018   ROS: Constitutional: no weight gain/+ weight loss (9 lbs since last visit), no fatigue, no subjective hyperthermia, no subjective hypothermia Eyes: no blurry vision, no xerophthalmia ENT: no sore throat, no nodules palpated in neck, no dysphagia, no odynophagia, no hoarseness Cardiovascular: no CP/no SOB/no palpitations/no leg swelling Respiratory: no cough/no SOB/no wheezing Gastrointestinal: no N/no V/no  D/no C/no acid reflux Musculoskeletal: no muscle aches/no joint aches Skin: no rashes, no hair loss Neurological: no tremors/no numbness/no tingling/no dizziness  I reviewed pt's medications, allergies, PMH, social hx, family hx, and changes were documented in the history of present illness. Otherwise, unchanged from my initial visit note.  Past medical history: - per HPI  Past Surgical History:  Procedure Laterality Date  . APPENDECTOMY     Social History   Socioeconomic History  . Marital status: married    Spouse name: Not on file  . Number of children: 75 - som (48 years old in 12/2017)  Social Needs  Occupational History  . machinist  Tobacco Use  . Smoking status: Never Smoker  . Smokeless tobacco: Never Used  Substance and Sexual Activity  . Alcohol use: Yes    Comment: Beer, once a month, 3-6 drinks  . Drug use: No   He is not on any prescription medications.  Allergies  Allergen Reactions  . Morphine And Related Hives   FH: - M with valvular insufficiency  PE: BP 122/80   Pulse 60   Ht 6' 0.75" (1.848 m)   Wt 226 lb (102.5 kg)   SpO2 98%   BMI 30.02 kg/m  Wt Readings from Last 3 Encounters:  08/20/20 226 lb (102.5 kg)  08/22/19 235 lb (106.6 kg)  08/16/18 236 lb 9.6 oz (107.3 kg)   Constitutional: overweight, in NAD Eyes: PERRLA, EOMI, no exophthalmos ENT: moist mucous membranes, no thyromegaly, no cervical lymphadenopathy Cardiovascular: RRR, No MRG Respiratory: CTA B Gastrointestinal: abdomen soft, NT, ND, BS+ Musculoskeletal: no deformities, strength intact in all 4 Skin: moist, warm, no rashes Neurological: no tremor with outstretched hands, DTR normal in all 4  ASSESSMENT: 1.  Elevated prolactin  2.  Pituitary microadenoma   3.  Hypogonadotropic hypogonadism  4.  Vitamin D deficiency  PLAN:  1. And 2.  Patient with history of pituitary microadenoma, discovered after diagnosis of hypogonadotropic hypogonadism and hyperprolactinemia.   Reviewed the report of his pituitary MRI from 2018: The tumor does not extend towards the optic chiasm he does not have any complaints of visual field cuts. -Restarted Cabergoline 0.25 mg twice a week and his prolactin level improved significantly.  At last visit, prolactin level was normal.  We did discuss that if the prolactin level improves further, we may be able to decrease the dose to 0.25 mg weekly -He denies significant side effects from Cabergoline: Dizziness, headaches, nausea, congestion.  However, he feels a little sluggish and occasionally has a mild headache in the morning after taking Cabergoline. -We will recheck his prolactin level today.  If it is lower, we should try to decrease the Cabergoline dose to once a week. -We discussed that if this level stays normal for the next 2-3 more years, we can try to give him a Cabergoline holiday  3. Patient with history of low total and free testosterone due to hypogonadotropic hypogonadism in the setting of hyperprolactinemia -He was able to father a child in his 35s.  No fertility problems at that time. -Reviewed his previous testosterone levels they normalized after normalization of his prolactin level -No hypogonadal symptoms at this visit  4.  Vitamin D deficiency -He has a history of low vitamin D at 22.46 in the past.  -We started him on 5000 units vitamin D daily, which she continues today   -His vitamin D level was normal at last check a year ago -We will recheck his level today  Needs refills.  Component     Latest Ref Rng & Units 08/20/2020  Prolactin     2.0 - 18.0 ng/mL 19.4 (H)  Vitamin D, 25-Hydroxy     30.0 - 100.0 ng/mL 59.5  His vitamin D level is normal, but prolactin is still slightly above the upper limit of normal.  Due to this, I would not suggest to decrease the Cabergoline dose for now.  Philemon Kingdom, MD PhD [facility] Endocrinology

## 2020-08-20 NOTE — Patient Instructions (Signed)
Please continue Cabergoline 0.25 mg 2x a week.  Continue vitamin D 5000 units daily.  Please stop at the lab.  Please come back for a follow-up appointment in 1 year. 

## 2020-08-21 LAB — VITAMIN D 25 HYDROXY (VIT D DEFICIENCY, FRACTURES): Vit D, 25-Hydroxy: 59.5 ng/mL (ref 30.0–100.0)

## 2020-08-23 MED ORDER — CABERGOLINE 0.5 MG PO TABS: ORAL_TABLET | ORAL | 4 refills | Status: DC

## 2020-09-02 ENCOUNTER — Encounter: Payer: Self-pay | Admitting: Internal Medicine

## 2021-08-26 ENCOUNTER — Encounter: Payer: Self-pay | Admitting: Internal Medicine

## 2021-08-26 ENCOUNTER — Ambulatory Visit: Payer: BC Managed Care – PPO | Admitting: Internal Medicine

## 2021-08-26 ENCOUNTER — Other Ambulatory Visit: Payer: Self-pay

## 2021-08-26 VITALS — BP 140/88 | HR 68 | Ht 72.75 in | Wt 225.6 lb

## 2021-08-26 DIAGNOSIS — D352 Benign neoplasm of pituitary gland: Secondary | ICD-10-CM | POA: Diagnosis not present

## 2021-08-26 DIAGNOSIS — E559 Vitamin D deficiency, unspecified: Secondary | ICD-10-CM | POA: Diagnosis not present

## 2021-08-26 DIAGNOSIS — E221 Hyperprolactinemia: Secondary | ICD-10-CM

## 2021-08-26 NOTE — Patient Instructions (Signed)
Please continue Cabergoline 0.25 mg 2x a week.  Continue vitamin D 5000 units daily.  Please stop at the lab.  Please come back for a follow-up appointment in 1 year. 

## 2021-08-26 NOTE — Progress Notes (Signed)
Patient ID: Frederick Durham, male   DOB: [birth year], 40 y.o.   MRN: [id number]   This visit occurred during the SARS-CoV-2 public health emergency.  Safety protocols were in place, including screening questions prior to the visit, additional usage of staff PPE, and extensive cleaning of exam room while observing appropriate contact time as indicated for disinfecting solutions.   HPI  Frederick Durham is a 40 y.o.-year-old male, initially referred by his PCP, Dr. Gar Ponto, returning for follow-up for subcentimeter pituitary mass associated with high prolactin level, hypogonadotropic hypogonadism, vitamin D deficiency.  Last visit 1 year ago.  Interim history: He has no new complaints at this visit: No weight gain, breast tenderness or discharge, decreased libido or problems with erections, headaches or vision changes.  Also, no nausea, dizziness, congestion, only rarely feeling sluggish and having a mild headache, but not clearly associated with taking Cabergoline.  Reviewed history: Patient describes that in 2018 he started to have increased fatigue, slight anxiety/depression, irritability.  Since then, he also started to gain weight (he believes he gained approximately 15 pounds over the last 6 months), also increased sweating.  He has slight decrease of libido but no problems with erections.  He saw his PCP and complained of the above symptoms.  The testosterone level was checked and it was low.  He was referred to urology.  Reviewed note and lab results from Dr. Diona Fanti.  An 8 am testosterone level was confirmed low (see below), and this was associated with inappropriately low LH and FSH (hypogonadotropic hypogonadism), but also a very high prolactin level, at 150.8.  A pituitary MRI was checked and this showed a microadenoma. At that point,patient was referred to endocrinology.  Patient has a history of elevated prolactin, of 150, which improved significantly after addition of cabergoline 0.25 mg  twice a week.   Prolactin levels were reviewed: Lab Results  Component Value Date   PROLACTIN 19.4 (H) 08/20/2020   PROLACTIN 16.7 08/22/2019   PROLACTIN 18.2 (H) 08/23/2018   PROLACTIN 18.8 (H) 04/19/2018   PROLACTIN 133.0 (H) 02/01/2018  09/28/2017: PRL 150.8, FSH 1.1, LH 2.3, 8 am testosterone: 119 ([id number]), Free T: 25.1 (46-224)  Pituitary MRI (10/26/2017): 7.9 x 7.3 x 4.2 mm nonenhancing pituitary nodule.  The actual images are not available for review.   His testosterone level was low in 01/2018 but normalized after normalization of his prolactin level: Component     Latest Ref Rng & Units 08/23/2018  Testosterone, Serum (Total)     ng/dL 355  % Free Testosterone     % 2.9  Free Testosterone, S     pg/mL 103  Sex Hormone Binding Globulin     nmol/L 19.2   Component     Latest Ref Rng & Units 02/01/2018  Testosterone, Serum (Total)     ng/dL 132 (L)  % Free Testosterone     % 2.5  Free Testosterone, S     pg/mL 33 (L)  Sex Hormone Binding Globulin     nmol/L 21.9  IGF-I, LC/MS     53 - 331 ng/mL 120  Z-Score (Male)     -2.0 - 2 SD -0.4  C206 ACTH     6 - 50 pg/mL 26  TSH     0.35 - 4.50 uIU/mL 1.72  Cortisol, Plasma     ug/dL 11.4  T4,Free(Direct)     0.60 - 1.60 ng/dL 0.97  Triiodothyronine,Free,Serum     2.3 - 4.2 pg/mL 3.2  He has a history of vitamin D deficiency, which resolved after starting vitamin D supplement.  He was previously on 2000 units daily, then increased to 5000 units daily.  He takes this daily. Lab Results  Component Value Date   VD25OH 59.5 08/20/2020   VD25OH 52.63 08/22/2019   VD25OH 56.29 08/23/2018   VD25OH 41.55 04/19/2018   VD25OH 22.46 (L) 02/01/2018   ROS: + See HPI  I reviewed pt's medications, allergies, PMH, social hx, family hx, and changes were documented in the history of present illness. Otherwise, unchanged from my initial visit note.  Past medical history: - per HPI  Past Surgical History:  Procedure  Laterality Date   APPENDECTOMY     Social History   Socioeconomic History   Marital status: married    Spouse name: Not on file   Number of children: 28 - son (34 years old in 12/2017)  Social Needs  Occupational History   machinist  Tobacco Use   Smoking status: Never Smoker   Smokeless tobacco: Never Used  Substance and Sexual Activity   Alcohol use: Yes    Comment: Beer, once a month, 3-6 drinks   Drug use: No   He is not on any prescription medications.  Allergies  Allergen Reactions   Morphine And Related Hives   FH: - M with valvular insufficiency  PE: BP 140/88 (BP Location: Right Arm, Patient Position: Sitting, Cuff Size: Normal)   Pulse 68   Ht 6' 0.75" (1.848 m)   Wt 225 lb 9.6 oz (102.3 kg)   SpO2 96%   BMI 29.97 kg/m  Wt Readings from Last 3 Encounters:  08/26/21 225 lb 9.6 oz (102.3 kg)  08/20/20 226 lb (102.5 kg)  08/22/19 235 lb (106.6 kg)   Constitutional: overweight, in NAD Eyes: PERRLA, EOMI, no exophthalmos ENT: moist mucous membranes, no thyromegaly, no cervical lymphadenopathy Cardiovascular: RRR, No MRG Respiratory: CTA B Gastrointestinal: abdomen soft, NT, ND, BS+ Musculoskeletal: no deformities, strength intact in all 4 Skin: moist, warm, no rashes Neurological: no tremor with outstretched hands, DTR normal in all 4  ASSESSMENT: 1.  Elevated prolactin  2.  Pituitary microadenoma   3.  Hypogonadotropic hypogonadism  4.  Vitamin D deficiency  PLAN:  1. And 2.  Patient with history of pituitary microadenoma, discovered after diagnosis of hypogonadotropic hypogonadism and hyperprolactinemia.  Reviewed his pituitary MRI report from 2018: The tumor did not extend toward the optic chiasm.  Also, he does not have any visual field cuts. -After starting Cabergoline, his prolactin level improved significantly.  At last visit, his level was 19.4, slightly high, so we continued the same dose of dopamine agonist. -No significant side effects  from Cabergoline: Dizziness, headaches, congestion, nausea.  At last visit, he mentions feeling a little sluggish and occasionally has mild headache in the morning after taking Cabergoline. However, these episodes are less frequent now and not clearly associated with taking Cabergoline the night before.  -We will recheck his prolactin level today.  After the results are back, depending on the level, we may try to decrease the dose of Cabergoline to once a day. -However, for now, I advised him to continue the current dose, 0.25 mg twice a week. -We did discuss that if this levels stay normal for the next 3 to 5 years, we may give him a Cabergoline holiday  3.  Patient with history of low total and free testosterone due to hypogonadotropic hypogonadism in the setting of hyperprolactinemia -He was able  to father a child in his 73s.  No fertility problems at that time. -Reviewed his previous testosterone levels and they normalized after normalization of his prolactin level -He denies problems with erections or decreased libido.  No breast tenderness. -No further investigation needed for this  4.  Vitamin D deficiency -He has a history of low vitamin D, at 26 -We started him on 5000 units vitamin D daily, which he continues today -No muscle aches or joint pain -Vitamin D level was normal last year, at 80.5 -We will repeat a level today  Component     Latest Ref Rng & Units 08/26/2021  Prolactin     2.0 - 18.0 ng/mL 16.4  Vitamin D, 25-Hydroxy     30.0 - 100.0 ng/mL 74.4  Normal prolactin and vitamin D levels.  For now, I would suggest to continue the same dose of Cabergoline and vitamin D supplement.   Philemon Kingdom, MD PhD [facility] Endocrinology

## 2021-08-27 LAB — PROLACTIN: Prolactin: 16.4 ng/mL (ref 2.0–18.0)

## 2021-08-27 LAB — VITAMIN D 25 HYDROXY (VIT D DEFICIENCY, FRACTURES): Vit D, 25-Hydroxy: 74.4 ng/mL (ref 30.0–100.0)

## 2022-01-01 ENCOUNTER — Other Ambulatory Visit: Payer: Self-pay | Admitting: Internal Medicine

## 2022-09-01 ENCOUNTER — Ambulatory Visit: Payer: BC Managed Care – PPO | Admitting: Internal Medicine

## 2022-09-08 ENCOUNTER — Ambulatory Visit: Payer: BC Managed Care – PPO | Admitting: Internal Medicine

## 2022-09-08 ENCOUNTER — Encounter: Payer: Self-pay | Admitting: Internal Medicine

## 2022-09-08 VITALS — BP 130/78 | HR 54 | Ht 72.75 in | Wt 212.0 lb

## 2022-09-08 DIAGNOSIS — E559 Vitamin D deficiency, unspecified: Secondary | ICD-10-CM

## 2022-09-08 DIAGNOSIS — D352 Benign neoplasm of pituitary gland: Secondary | ICD-10-CM | POA: Diagnosis not present

## 2022-09-08 DIAGNOSIS — E221 Hyperprolactinemia: Secondary | ICD-10-CM | POA: Diagnosis not present

## 2022-09-08 LAB — VITAMIN D 25 HYDROXY (VIT D DEFICIENCY, FRACTURES): VITD: 66.68 ng/mL (ref 30.00–100.00)

## 2022-09-08 NOTE — Progress Notes (Unsigned)
Patient ID: Frederick Durham, male   DOB: February 24, 1981, 41 y.o.   MRN: [id number]   HPI  Frederick Durham is a 41 y.o.-year-old male, initially referred by his PCP, Dr. Gar Ponto, returning for follow-up for pituitary microadenoma, high prolactin level, hypogonadotropic hypogonadism, vitamin D deficiency.  Last visit 1 year ago.  Interim history: He has no new complaints at this visit: No weight gain, breast tenderness or discharge, decreased libido or problems with erections, headaches or vision changes.  Also, no nausea, dizziness, congestion.  Reviewed history: Patient describes that in 2018 he started to have increased fatigue, slight anxiety/depression, irritability.  Since then, he also started to gain weight (he believes he gained approximately 15 pounds over the last 6 months), also increased sweating.  He has slight decrease of libido but no problems with erections.  He saw his PCP and complained of the above symptoms.  The testosterone level was checked and it was low.  He was referred to urology.  Reviewed note and lab results from Dr. Diona Fanti.  An 8 am testosterone level was confirmed low (see below), and this was associated with inappropriately low LH and FSH (hypogonadotropic hypogonadism), but also a very high prolactin level, at 150.8.  A pituitary MRI was checked and this showed a microadenoma. At that point,patient was referred to endocrinology.  Patient has a history of elevated prolactin, of 150, which improved significantly after addition of cabergoline 0.25 mg twice a week. He continues on this dose.  Prolactin levels were reviewed: Lab Results  Component Value Date   PROLACTIN 16.4 08/26/2021   PROLACTIN 19.4 (H) 08/20/2020   PROLACTIN 16.7 08/22/2019   PROLACTIN 18.2 (H) 08/23/2018   PROLACTIN 18.8 (H) 04/19/2018   PROLACTIN 133.0 (H) 02/01/2018  09/28/2017: PRL 150.8, FSH 1.1, LH 2.3, 8 am testosterone: 119 ([id number]), Free T: 25.1 (46-224)  Pituitary MRI (10/26/2017):  7.9 x 7.3 x 4.2 mm nonenhancing pituitary nodule.  The actual images are not available for review.   His testosterone level was low in 01/2018 but normalized after normalization of his prolactin level: Component     Latest Ref Rng & Units 08/23/2018  Testosterone, Serum (Total)     ng/dL 355  % Free Testosterone     % 2.9  Free Testosterone, S     pg/mL 103  Sex Hormone Binding Globulin     nmol/L 19.2   Component     Latest Ref Rng & Units 02/01/2018  Testosterone, Serum (Total)     ng/dL 132 (L)  % Free Testosterone     % 2.5  Free Testosterone, S     pg/mL 33 (L)  Sex Hormone Binding Globulin     nmol/L 21.9  IGF-I, LC/MS     53 - 331 ng/mL 120  Z-Score (Male)     -2.0 - 2 SD -0.4  C206 ACTH     6 - 50 pg/mL 26  TSH     0.35 - 4.50 uIU/mL 1.72  Cortisol, Plasma     ug/dL 11.4  T4,Free(Direct)     0.60 - 1.60 ng/dL 0.97  Triiodothyronine,Free,Serum     2.3 - 4.2 pg/mL 3.2   He has a history of vitamin D deficiency, which resolved after starting vitamin D supplement.  He was previously on 2000 units daily, then increased to 5000 units daily.  He takes this daily - only rarely misses doses. Lab Results  Component Value Date   VD25OH 74.4 08/26/2021   VD25OH 59.5  08/20/2020   VD25OH 52.63 08/22/2019   VD25OH 56.29 08/23/2018   VD25OH 41.55 04/19/2018   VD25OH 22.46 (L) 02/01/2018   ROS: + See HPI  I reviewed pt's medications, allergies, PMH, social hx, family hx, and changes were documented in the history of present illness. Otherwise, unchanged from my initial visit note.  Past medical history: - per HPI  Past Surgical History:  Procedure Laterality Date   APPENDECTOMY     Social History   Socioeconomic History   Marital status: married    Spouse name: Not on file   Number of children: 21 - son (78 years old in 12/2017)  Social Needs  Occupational History   machinist  Tobacco Use   Smoking status: Never Smoker   Smokeless tobacco: Never Used   Substance and Sexual Activity   Alcohol use: Yes    Comment: Beer, once a month, 3-6 drinks   Drug use: No   He is not on any prescription medications.  Allergies  Allergen Reactions   Morphine And Related Hives   FH: - M with valvular insufficiency  PE: There were no vitals taken for this visit. Wt Readings from Last 3 Encounters:  08/26/21 225 lb 9.6 oz (102.3 kg)  08/20/20 226 lb (102.5 kg)  08/22/19 235 lb (106.6 kg)   Constitutional: overweight, in NAD Eyes:  EOMI, no exophthalmos ENT: no neck masses, no cervical lymphadenopathy Cardiovascular: RRR, No MRG Respiratory: CTA B Musculoskeletal: no deformities Skin:no rashes Neurological: no tremor with outstretched hands  ASSESSMENT: 1.  Elevated prolactin  2.  Pituitary microadenoma   3.  Hypogonadotropic hypogonadism  4.  Vitamin D deficiency  PLAN:  1. And 2.  Patient with history of pituitary microadenoma, discovered after diagnosis of hypogonadotropic hypogonadism and hyperprolactinemia.  Reviewed the pituitary MRI report from 2018: The tumor did not extend toward the optic chiasm.  He did not have any visual field cuts.  I discussed with the patient to check another MRI, now, 5 years from the previous. -After starting Cabergoline, his prolactin level improved significantly.  At last visit, this was 16.4, normal. -No significant side effects from Cabergoline: He denies dizziness, headaches, congestion, nausea.  -We will recheck his prolactin level today.  Depending on the level, we may be able to reduce his Cabergoline dose. -We will continue Cabergoline 0.25 mg twice a week -He may be a candidate for Cabergoline holiday in the next 3 to 5 years, depending on his prolactin levels, which may be possible especially if his pituitary tumor has decreased in size  3.  Patient with history of low total and free testosterone due to hypogonadotropic hypogonadism in the setting of hyperprolactinemia -Able to father a  child in his 65s.  No fertility problems at that time -Reviewing his previous testosterone levels, they normalized after normalization of his prolactin level -no problems with erections or decreased libido.  No breast tenderness. -No further investigation needed for this  4.  Vitamin D deficiency -History of a low vitamin D, of 22 -We started him on 5000 and vitamin D daily-he continues on this now -No muscle or joint aches -At last visit, vitamin D level was normal, at 74.4. -We will recheck a level today  Needs refills.  Philemon Kingdom, MD PhD [facility] Endocrinology

## 2022-09-08 NOTE — Patient Instructions (Signed)
Please continue Cabergoline 0.25 mg 2x a week.  Continue vitamin D 5000 units daily.  Please stop at the lab.  Please come back for a follow-up appointment in 1 year.

## 2022-09-09 LAB — PROLACTIN: Prolactin: 11.9 ng/mL (ref 2.0–18.0)

## 2022-09-11 MED ORDER — CABERGOLINE 0.5 MG PO TABS: ORAL_TABLET | ORAL | 3 refills | Status: DC

## 2023-09-11 ENCOUNTER — Encounter: Payer: Self-pay | Admitting: Internal Medicine

## 2023-09-11 ENCOUNTER — Ambulatory Visit: Payer: PRIVATE HEALTH INSURANCE | Admitting: Internal Medicine

## 2023-09-11 VITALS — BP 110/64 | HR 58 | Ht 72.75 in | Wt 235.2 lb

## 2023-09-11 DIAGNOSIS — E559 Vitamin D deficiency, unspecified: Secondary | ICD-10-CM

## 2023-09-11 DIAGNOSIS — D352 Benign neoplasm of pituitary gland: Secondary | ICD-10-CM | POA: Diagnosis not present

## 2023-09-11 DIAGNOSIS — E221 Hyperprolactinemia: Secondary | ICD-10-CM | POA: Diagnosis not present

## 2023-09-11 LAB — VITAMIN D 25 HYDROXY (VIT D DEFICIENCY, FRACTURES): VITD: 56.65 ng/mL (ref 30.00–100.00)

## 2023-09-11 NOTE — Patient Instructions (Addendum)
Please continue Cabergoline 0.25 mg 1x a week.  Continue vitamin D 5000 units daily.  Let's get a new pituitary MRI.  Please stop at the lab.  Please come back for a follow-up appointment in 1 year.

## 2023-09-11 NOTE — Progress Notes (Unsigned)
Patient ID: OTT ESSE, male   DOB: 16-May-1981, 42 y.o.   MRN: [id number]   HPI  MCGWIRE GAEBLER is a 42 y.o.-year-old male, initially referred by his PCP, Dr. Donzetta Sprung, returning for follow-up for pituitary microadenoma, high prolactin level, hypogonadotropic hypogonadism, vitamin D deficiency.  Last visit 1 year ago.  Interim history: He has no new complaints at this visit: No breast tenderness or discharge, decreased libido, headaches or vision changes.  Also, no nausea, dizziness, congestion.  Reviewed and addended history: Patient describes that in 2018 he started to have increased fatigue, slight anxiety/depression, irritability.  Since then, he also started to gain weight (he believes he gained approximately 15 pounds over the last 6 months), also increased sweating.  He has slight decrease of libido but no problems with erections.  He saw his PCP and complained of the above symptoms.  The testosterone level was checked and it was low.  He was referred to urology.  Reviewed note and lab results from Dr. Retta Diones.  An 8 am testosterone level was confirmed low (see below), and this was associated with inappropriately low LH and FSH (hypogonadotropic hypogonadism), but also a very high prolactin level, at 150.8.  A pituitary MRI was checked and this showed a microadenoma. At that point,patient was referred to endocrinology.  He initially had an elevated prolactin, of 150, which improved significantly after addition of cabergoline 0.25 mg twice a week.  In 08/2022, we reduced the dose to 0.25 mg once a week  Prolactin levels were reviewed: Lab Results  Component Value Date   PROLACTIN 11.9 09/08/2022   PROLACTIN 16.4 08/26/2021   PROLACTIN 19.4 (H) 08/20/2020   PROLACTIN 16.7 08/22/2019   PROLACTIN 18.2 (H) 08/23/2018   PROLACTIN 18.8 (H) 04/19/2018   PROLACTIN 133.0 (H) 02/01/2018  09/28/2017: PRL 150.8, FSH 1.1, LH 2.3, 8 am testosterone: 119 ([phone]), Free T: 25.1  (46-224)  Pituitary MRI (10/26/2017): 7.9 x 7.3 x 4.2 mm nonenhancing pituitary nodule.  The actual images are not available for review.   His testosterone level was low in 01/2018 but normalized after normalization of his prolactin level: Component     Latest Ref Rng & Units 08/23/2018  Testosterone, Serum (Total)     ng/dL 409  % Free Testosterone     % 2.9  Free Testosterone, S     pg/mL 103  Sex Hormone Binding Globulin     nmol/L 19.2   Component     Latest Ref Rng & Units 02/01/2018  Testosterone, Serum (Total)     ng/dL 811 (L)  % Free Testosterone     % 2.5  Free Testosterone, S     pg/mL 33 (L)  Sex Hormone Binding Globulin     nmol/L 21.9  IGF-I, LC/MS     53 - 331 ng/mL 120  Z-Score (Male)     -2.0 - 2 SD -0.4  C206 ACTH     6 - 50 pg/mL 26  TSH     0.35 - 4.50 uIU/mL 1.72  Cortisol, Plasma     ug/dL 91.4  N8,GNFA(OZHYQM)     0.60 - 1.60 ng/dL 5.78  Triiodothyronine,Free,Serum     2.3 - 4.2 pg/mL 3.2   He has a history of vitamin D deficiency, which resolved after starting vitamin D supplement.  He was previously on 2000 units daily, then increased to 5000 units daily.  He takes this daily - only rarely misses doses. Lab Results  Component Value Date  VD25OH 66.68 09/08/2022   VD25OH 74.4 08/26/2021   VD25OH 59.5 08/20/2020   VD25OH 52.63 08/22/2019   VD25OH 56.29 08/23/2018   VD25OH 41.55 04/19/2018   VD25OH 22.46 (L) 02/01/2018   ROS: + See HPI  I reviewed pt's medications, allergies, PMH, social hx, family hx, and changes were documented in the history of present illness. Otherwise, unchanged from my initial visit note.  Past medical history: - per HPI  Past Surgical History:  Procedure Laterality Date   APPENDECTOMY     Social History   Socioeconomic History   Marital status: married    Spouse name: Not on file   Number of children: 39 - son (42 years old in 12/2017)  Social Needs  Occupational History   machinist  Tobacco Use    Smoking status: Never Smoker   Smokeless tobacco: Never Used  Substance and Sexual Activity   Alcohol use: Yes    Comment: Beer, once a month, 3-6 drinks   Drug use: No   He is not on any prescription medications.  Allergies  Allergen Reactions   Morphine And Codeine Hives   FH: - M with valvular insufficiency  PE: There were no vitals taken for this visit. Wt Readings from Last 3 Encounters:  09/08/22 212 lb (96.2 kg)  08/26/21 225 lb 9.6 oz (102.3 kg)  08/20/20 226 lb (102.5 kg)   Constitutional: overweight, in NAD Eyes:  EOMI, no exophthalmos ENT: no neck masses, no cervical lymphadenopathy Cardiovascular: RRR, No MRG Respiratory: CTA B Musculoskeletal: no deformities Skin:no rashes Neurological: no tremor with outstretched hands  ASSESSMENT: 1.  Elevated prolactin  2.  Pituitary microadenoma   3.  Hypogonadotropic hypogonadism  4.  Vitamin D deficiency  PLAN:  1. And 2.  Patient with history of pituitary microadenoma, discovered after diagnosis of hypogonadotropic hypogonadism and hyperprolactinemia.  Reviewed the report of his pituitary MRI from 2019: The tumor did not extend toward the optic chiasm.  He did not have visual field cuts.  I discussed with the patient about checking another MRI 5 years from the previous.  I ordered this at last visit but he did not have it done yet.  Will order it today. -After starting Cabergoline 0.25 mg twice a week, his prolactin level improved significantly.  At last visit, this was 11.9.  At that time, I advised him to reduce Cabergoline to 0.25 mg once a week.  He did not return for labs 2 months after the change in dose... We discussed that we need to recheck labs in 1 to 2 months after any Cabergoline dose changes. -He tolerates Cabergoline well, without dizziness, headaches, congestion, nausea -Will recheck his prolactin level today.  Depending on the level, we may be able to reduce his Cabergoline dose even more, 0.25 mg once  every 2 weeks -He may be a candidate for Cabergoline holiday if both remain low after reducing the Cabergoline dose.  This will be possible especially if his pituitary tumor has decreased in size. -I will see him back in a year but possibly sooner for labs  3.  Patient with history of low total and free testosterone due to hypogonadotropic hypogonadism in the setting of hyperprolactinemia -He was able to father a child in his 30s.  No fertility problems at that time -Reviewing his previous testosterone levels, they normalized after normalization of his prolactin level -no problems with erections or decreased libido.  No breast tenderness. -No further investigation is needed for this  4.  Vitamin D deficiency -We started him on 5000 units vitamin D daily-he continues on this now -No muscle or joint aches -At last visit, vitamin D level was normal, at 67 -Will recheck the level today  Needs refills.  Carlus Pavlov, MD PhD [facility] Endocrinology

## 2023-09-13 MED ORDER — CABERGOLINE 0.5 MG PO TABS: ORAL_TABLET | ORAL | 5 refills | Status: DC

## 2023-09-14 ENCOUNTER — Encounter: Payer: Self-pay | Admitting: Internal Medicine

## 2023-10-23 ENCOUNTER — Ambulatory Visit
Admission: RE | Admit: 2023-10-23 | Discharge: 2023-10-23 | Disposition: A | Discharge status: Home / Self Care | Payer: PRIVATE HEALTH INSURANCE | Source: Ambulatory Visit | Attending: Internal Medicine | Admitting: Internal Medicine

## 2023-10-23 DIAGNOSIS — D352 Benign neoplasm of pituitary gland: Secondary | ICD-10-CM

## 2023-10-23 DIAGNOSIS — E221 Hyperprolactinemia: Secondary | ICD-10-CM

## 2023-10-23 MED ORDER — GADOPICLENOL 0.5 MMOL/ML IV SOLN
10.0000 mL | Freq: Once | INTRAVENOUS | Status: AC | PRN
  Administered 2023-10-23: 10 mL via INTRAVENOUS

## 2023-12-08 ENCOUNTER — Other Ambulatory Visit: Payer: Self-pay | Admitting: Internal Medicine

## 2024-09-10 ENCOUNTER — Ambulatory Visit: Payer: PRIVATE HEALTH INSURANCE | Admitting: Internal Medicine

## 2024-09-17 ENCOUNTER — Ambulatory Visit: Payer: PRIVATE HEALTH INSURANCE | Admitting: Internal Medicine

## 2024-09-26 ENCOUNTER — Other Ambulatory Visit: Payer: PRIVATE HEALTH INSURANCE

## 2024-09-26 ENCOUNTER — Ambulatory Visit: Payer: PRIVATE HEALTH INSURANCE | Admitting: Internal Medicine

## 2024-09-26 ENCOUNTER — Encounter: Payer: Self-pay | Admitting: Internal Medicine

## 2024-09-26 VITALS — BP 120/80 | HR 67 | Ht 72.75 in | Wt 241.2 lb

## 2024-09-26 DIAGNOSIS — E221 Hyperprolactinemia: Secondary | ICD-10-CM

## 2024-09-26 DIAGNOSIS — D352 Benign neoplasm of pituitary gland: Secondary | ICD-10-CM | POA: Diagnosis not present

## 2024-09-26 DIAGNOSIS — E559 Vitamin D deficiency, unspecified: Secondary | ICD-10-CM

## 2024-09-26 LAB — VITAMIN D 25 HYDROXY (VIT D DEFICIENCY, FRACTURES): Vit D, 25-Hydroxy: 51 ng/mL (ref 30–100)

## 2024-09-26 LAB — PROLACTIN: Prolactin: 26.3 ng/mL — ABNORMAL HIGH (ref 2.0–18.0)

## 2024-09-26 NOTE — Progress Notes (Addendum)
 Patient ID: Frederick Durham, male   DOB: 1981/08/09, 43 y.o.   MRN: [id number]   HPI  Frederick Durham is a 43 y.o.-year-old male, initially referred by his PCP, Dr. Jerel Sieving, returning for follow-up for pituitary microadenoma, high prolactin level, hypogonadotropic hypogonadism, vitamin D  deficiency.  Last visit 1 year ago.  Interim history: He has no new complaints at this visit: No breast tenderness or discharge, decreased libido, headaches or vision changes.   He stopped Cabergoline  approximately 6 months ago after his insurance stopped covering it.  He does not feel differently after stopping it.  Reviewed and addended history: Patient describes that in 2018 he started to have increased fatigue, slight anxiety/depression, irritability.  Since then, he also started to gain weight (he believes he gained approximately 15 pounds over the last 6 months), also increased sweating.  He has slight decrease of libido but no problems with erections.  He saw his PCP and complained of the above symptoms.  The testosterone  level was checked and it was low.  He was referred to urology.  Reviewed note and lab results from Dr. Matilda.  An 8 am testosterone  level was confirmed low (see below), and this was associated with inappropriately low LH and FSH (hypogonadotropic hypogonadism), but also a very high prolactin level, at 150.8.  A pituitary MRI was checked and this showed a microadenoma. At that point,patient was referred to endocrinology.  He initially had an elevated prolactin, of 150, which improved significantly after addition of cabergoline  0.25 mg twice a week.  In 08/2022, we reduced the dose to 0.25 mg once a week.  03/2024, he came off Cabergoline  b/c not covered by his insurance >> will change insurances in 4-6 weeks.  Prolactin levels were reviewed: Lab Results  Component Value Date   PROLACTIN 31.2 (H) 09/11/2023   PROLACTIN 11.9 09/08/2022   PROLACTIN 16.4 08/26/2021   PROLACTIN 19.4  (H) 08/20/2020   PROLACTIN 16.7 08/22/2019   PROLACTIN 18.2 (H) 08/23/2018   PROLACTIN 18.8 (H) 04/19/2018   PROLACTIN 133.0 (H) 02/01/2018  09/28/2017: PRL 150.8, FSH 1.1, LH 2.3, 8 am testosterone : 119 [phone] Free T: 25.1 (46-224)  Pituitary MRI (10/26/2017): 7.9 x 7.3 x 4.2 mm nonenhancing pituitary nodule.  The actual images are not available for review.   Pituitary MRI (11/16/2023):  1. No convincing pituitary lesion identified. Small focus of T2 hyperintensity along the inferior right aspect of the pituitary appears separate from the pituitary gland and is indeterminate but may represent prominent venous plexus, small cyst or postoperative changes. 2. No evidence of acute abnormality.  His testosterone  level was low in 01/2018 but normalized after normalization of his prolactin level: Component     Latest Ref Rng & Units 08/23/2018  Testosterone , Serum (Total)     ng/dL 644  % Free Testosterone      % 2.9  Free Testosterone , S     pg/mL 103  Sex Hormone Binding Globulin     nmol/L 19.2   Component     Latest Ref Rng & Units 02/01/2018  Testosterone , Serum (Total)     ng/dL 867 (L)  % Free Testosterone      % 2.5  Free Testosterone , S     pg/mL 33 (L)  Sex Hormone Binding Globulin     nmol/L 21.9  IGF-I, LC/MS     53 - 331 ng/mL 120  Z-Score (Male)     -2.0 - 2 SD -0.4  C206 ACTH      6 -  50 pg/mL 26  TSH     0.35 - 4.50 uIU/mL 1.72  Cortisol, Plasma     ug/dL 88.5  U5,Qmzz(Ipmzru)     0.60 - 1.60 ng/dL 9.02  Triiodothyronine,Free,Serum     2.3 - 4.2 pg/mL 3.2   He has a history of vitamin D  deficiency, which resolved after starting vitamin D  supplement.  He was previously on 2000 units daily, then increased to 5000 units daily.  He takes this daily. Lab Results  Component Value Date   VD25OH 56.65 09/11/2023   VD25OH 66.68 09/08/2022   VD25OH 74.4 08/26/2021   VD25OH 59.5 08/20/2020   VD25OH 52.63 08/22/2019   VD25OH 56.29 08/23/2018   VD25OH 41.55  04/19/2018   VD25OH 22.46 (L) 02/01/2018   ROS: + See HPI  I reviewed pt's medications, allergies, PMH, social hx, family hx, and changes were documented in the history of present illness. Otherwise, unchanged from my initial visit note.  Past medical history: - per HPI  Past Surgical History:  Procedure Laterality Date   APPENDECTOMY     Social History   Socioeconomic History   Marital status: married    Spouse name: Not on file   Number of children: 46 - son (70 years old in 12/2017)  Social Needs  Occupational History   machinist  Tobacco Use   Smoking status: Never Smoker   Smokeless tobacco: Never Used  Substance and Sexual Activity   Alcohol use: Yes    Comment: Beer, once a month, 3-6 drinks   Drug use: No   He is not on any prescription medications.  Allergies  Allergen Reactions   Morphine And Codeine Hives   FH: - M with valvular insufficiency  PE: BP 120/80   Pulse 67   Ht 6' 0.75 (1.848 m)   Wt 241 lb 3.2 oz (109.4 kg)   SpO2 97%   BMI 32.04 kg/m  Wt Readings from Last 3 Encounters:  09/26/24 241 lb 3.2 oz (109.4 kg)  09/11/23 235 lb 3.2 oz (106.7 kg)  09/08/22 212 lb (96.2 kg)   Constitutional: overweight, in NAD Eyes:  EOMI, no exophthalmos ENT: no neck masses, no cervical lymphadenopathy Cardiovascular: RRR, No MRG Respiratory: CTA B Musculoskeletal: no deformities Skin:no rashes Neurological: no tremor with outstretched hands  ASSESSMENT: 1.  Elevated prolactin  2.  Pituitary microadenoma   3.  Hypogonadotropic hypogonadism  4.  Vitamin D  deficiency  PLAN:  1. And 2.  Patient with history of pituitary microadenoma, discovered after diagnosis of hypogonadotropic hypogonadism and hyperprolactinemia.  Reviewed the report of his pituitary MRI from 2019: The tumor did not extend toward the optic chiasm.  He did not have visual field cuts.  -After starting Cabergoline  0.25 mg twice a week, prolactin level improved significantly and we  were able to reduce the dose to 0.25 mg only once a week.  At last visit, his prolactin level was slightly elevated and I advised him to return for recheck before changing the dose.  He did not return for this.  At today's visit he tells me that he actually came off Cabergoline  oximetry 6 months ago (0/2025), after his insurance stopped covering it. -At last visit, I did suggest another pituitary MRI.  He had this on 11/16/2023.  There was no convincing pituitary adenoma seen anymore! - At today's visit, we will recheck his prolactin level and see if we need to restart Cabergoline . - I will see him back in a year but possibly sooner for  labs  3.  Patient with history of low total and free testosterone  due to hypogonadotropic hypogonadism in the setting of hyperprolactinemia -He was able to father a child in his 30s.  No fertility problems at that time -Reviewing his previous testosterone  levels, they normalized after normalization of his prolactin level -no problems with erections or decreased libido.  No breast tenderness. - No further investigation is needed for this  4.  Vitamin D  deficiency - Started him on vitamin D  5000 units daily.  He continues on this dose - Vitamin D  level was normal at last visit, at 56.65 - Will recheck a level today  Needs refills.  Orders Placed This Encounter  Procedures   Prolactin   VITAMIN D  25 Hydroxy (Vit-D Deficiency, Fractures)   Component     Latest Ref Rng 09/26/2024  Prolactin     2.0 - 18.0 ng/mL 26.3 (H)   Vitamin D , 25-Hydroxy     30 - 100 ng/mL 51   Vitamin D  level is normal.   Prolactin level is only slightly elevated but lower than previously.  I would like to repeat the level in 6 months, but I do not absolutely feel that he needs to restart Cabergoline .  Lela Fendt, MD PhD [facility] Endocrinology

## 2024-09-26 NOTE — Patient Instructions (Signed)
 Please continue off Cabergoline .  Continue vitamin D  5000 units daily.  Please stop at the lab.  Please come back for a follow-up appointment in 1 year.

## 2024-09-29 ENCOUNTER — Ambulatory Visit: Payer: Self-pay | Admitting: Internal Medicine

## 2024-09-29 NOTE — Addendum Note (Signed)
 Addended by: TRIXIE FILE on: 09/29/2024 12:57 PM   Modules accepted: Orders

## 2025-09-28 ENCOUNTER — Ambulatory Visit: Payer: PRIVATE HEALTH INSURANCE | Admitting: Internal Medicine
